# Patient Record
Sex: Male | Born: 1970 | Race: Black or African American | Hispanic: No | Marital: Single | Smoking: Current every day smoker
Health system: Southern US, Community
[De-identification: ages and names within clinical notes are randomized; demographics above are authoritative.]

## PROBLEM LIST (undated history)

## (undated) DIAGNOSIS — F1910 Other psychoactive substance abuse, uncomplicated: Secondary | ICD-10-CM

## (undated) DIAGNOSIS — I10 Essential (primary) hypertension: Secondary | ICD-10-CM

## (undated) DIAGNOSIS — F410 Panic disorder [episodic paroxysmal anxiety] without agoraphobia: Secondary | ICD-10-CM

## (undated) DIAGNOSIS — K859 Acute pancreatitis without necrosis or infection, unspecified: Secondary | ICD-10-CM

## (undated) DIAGNOSIS — Z720 Tobacco use: Secondary | ICD-10-CM

## (undated) DIAGNOSIS — F319 Bipolar disorder, unspecified: Secondary | ICD-10-CM

## (undated) DIAGNOSIS — F419 Anxiety disorder, unspecified: Secondary | ICD-10-CM

---

## 2001-05-11 ENCOUNTER — Emergency Department (HOSPITAL_COMMUNITY): Admission: EM | Admit: 2001-05-11 | Discharge: 2001-05-11 | Payer: Self-pay | Admitting: *Deleted

## 2001-05-11 ENCOUNTER — Encounter: Payer: Self-pay | Admitting: *Deleted

## 2001-05-22 ENCOUNTER — Ambulatory Visit (HOSPITAL_COMMUNITY): Admission: RE | Admit: 2001-05-22 | Discharge: 2001-05-22 | Payer: Self-pay | Admitting: Family Medicine

## 2001-05-22 ENCOUNTER — Encounter: Payer: Self-pay | Admitting: Family Medicine

## 2001-12-03 ENCOUNTER — Emergency Department (HOSPITAL_COMMUNITY): Admission: EM | Admit: 2001-12-03 | Discharge: 2001-12-03 | Payer: Self-pay | Admitting: *Deleted

## 2001-12-03 ENCOUNTER — Encounter: Payer: Self-pay | Admitting: *Deleted

## 2002-05-21 ENCOUNTER — Emergency Department (HOSPITAL_COMMUNITY): Admission: EM | Admit: 2002-05-21 | Discharge: 2002-05-22 | Payer: Self-pay | Admitting: Emergency Medicine

## 2002-05-22 ENCOUNTER — Encounter: Payer: Self-pay | Admitting: Emergency Medicine

## 2002-09-08 ENCOUNTER — Emergency Department (HOSPITAL_COMMUNITY): Admission: EM | Admit: 2002-09-08 | Discharge: 2002-09-08 | Payer: Self-pay | Admitting: Emergency Medicine

## 2003-05-13 ENCOUNTER — Emergency Department (HOSPITAL_COMMUNITY): Admission: EM | Admit: 2003-05-13 | Discharge: 2003-05-13 | Payer: Self-pay | Admitting: Emergency Medicine

## 2004-03-31 ENCOUNTER — Emergency Department (HOSPITAL_COMMUNITY): Admission: EM | Admit: 2004-03-31 | Discharge: 2004-03-31 | Payer: Self-pay | Admitting: *Deleted

## 2004-11-20 ENCOUNTER — Emergency Department (HOSPITAL_COMMUNITY): Admission: EM | Admit: 2004-11-20 | Discharge: 2004-11-20 | Payer: Self-pay | Admitting: *Deleted

## 2004-12-24 ENCOUNTER — Emergency Department (HOSPITAL_COMMUNITY): Admission: EM | Admit: 2004-12-24 | Discharge: 2004-12-24 | Payer: Self-pay | Admitting: Emergency Medicine

## 2005-01-10 ENCOUNTER — Emergency Department (HOSPITAL_COMMUNITY): Admission: EM | Admit: 2005-01-10 | Discharge: 2005-01-10 | Payer: Self-pay | Admitting: *Deleted

## 2005-02-18 ENCOUNTER — Emergency Department (HOSPITAL_COMMUNITY): Admission: EM | Admit: 2005-02-18 | Discharge: 2005-02-18 | Payer: Self-pay | Admitting: *Deleted

## 2005-02-19 ENCOUNTER — Emergency Department (HOSPITAL_COMMUNITY): Admission: EM | Admit: 2005-02-19 | Discharge: 2005-02-19 | Payer: Self-pay | Admitting: Emergency Medicine

## 2005-02-20 ENCOUNTER — Ambulatory Visit: Payer: Self-pay | Admitting: Family Medicine

## 2006-11-01 ENCOUNTER — Emergency Department (HOSPITAL_COMMUNITY): Admission: EM | Admit: 2006-11-01 | Discharge: 2006-11-01 | Payer: Self-pay | Admitting: Emergency Medicine

## 2007-02-13 ENCOUNTER — Emergency Department (HOSPITAL_COMMUNITY): Admission: EM | Admit: 2007-02-13 | Discharge: 2007-02-13 | Payer: Self-pay | Admitting: Emergency Medicine

## 2007-04-06 ENCOUNTER — Emergency Department (HOSPITAL_COMMUNITY): Admission: EM | Admit: 2007-04-06 | Discharge: 2007-04-06 | Payer: Self-pay | Admitting: Emergency Medicine

## 2007-04-28 ENCOUNTER — Emergency Department (HOSPITAL_COMMUNITY): Admission: EM | Admit: 2007-04-28 | Discharge: 2007-04-28 | Payer: Self-pay | Admitting: Emergency Medicine

## 2008-04-12 ENCOUNTER — Ambulatory Visit: Payer: Self-pay | Admitting: Internal Medicine

## 2008-04-12 ENCOUNTER — Inpatient Hospital Stay (HOSPITAL_COMMUNITY): Admission: EM | Admit: 2008-04-12 | Discharge: 2008-04-14 | Payer: Self-pay | Admitting: Emergency Medicine

## 2009-01-24 ENCOUNTER — Emergency Department (HOSPITAL_COMMUNITY): Admission: EM | Admit: 2009-01-24 | Discharge: 2009-01-24 | Payer: Self-pay | Admitting: Emergency Medicine

## 2009-02-08 ENCOUNTER — Emergency Department (HOSPITAL_COMMUNITY): Admission: EM | Admit: 2009-02-08 | Discharge: 2009-02-08 | Payer: Self-pay | Admitting: Emergency Medicine

## 2009-07-05 ENCOUNTER — Emergency Department (HOSPITAL_COMMUNITY): Admission: EM | Admit: 2009-07-05 | Discharge: 2009-07-05 | Payer: Self-pay | Admitting: Emergency Medicine

## 2009-10-02 IMAGING — US US ABDOMEN COMPLETE
1 series · 14 of 25 positions shown · non-contrast
Comparison: CT 04/12/2008

CLINICAL DATA: Acute pancreatitis

ABDOMEN ULTRASOUND
TECHNIQUE: Complete abdominal ultrasound examination was performed
including evaluation of the liver, gallbladder, bile ducts,
pancreas, kidneys, spleen, IVC, and abdominal aorta.

[Series 1: unknown · 0.25mm/px · 14 of 70 slices shown]
[im 1/70]
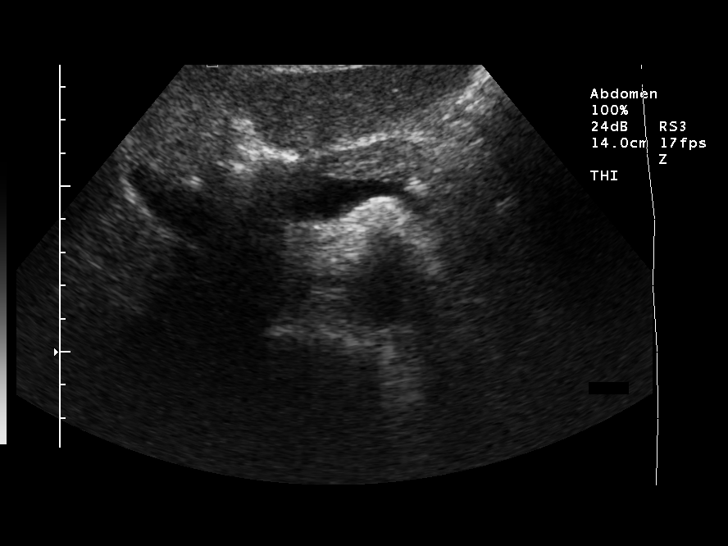
[im 6/70]
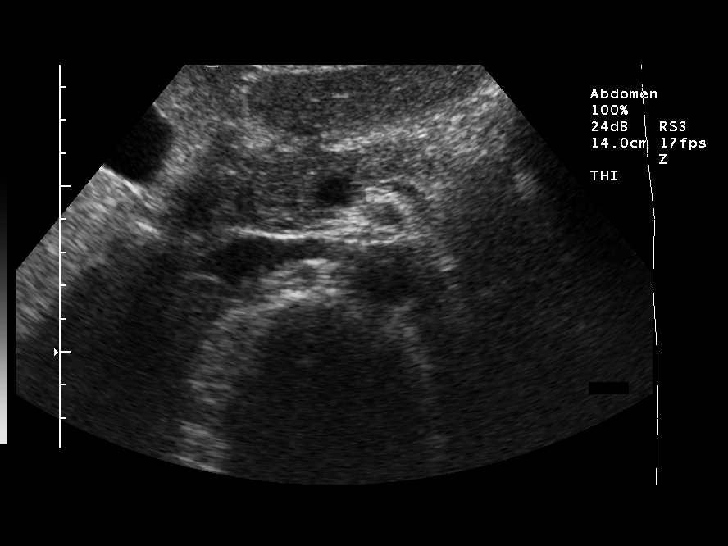
[im 12/70]
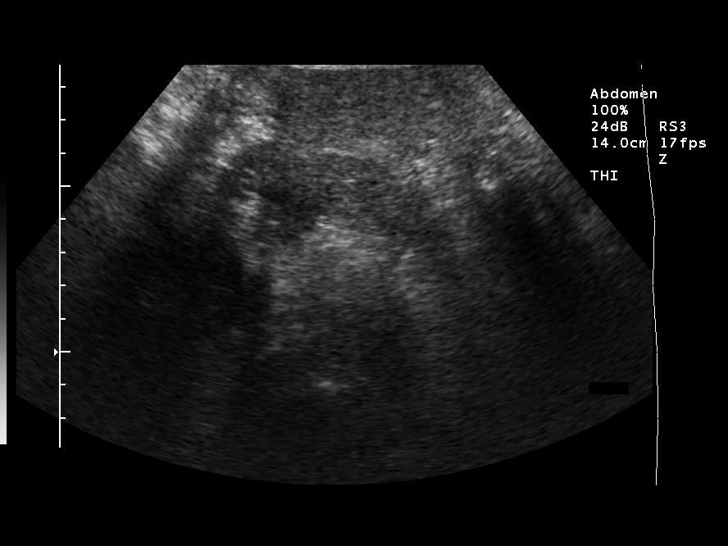
[im 18/70]
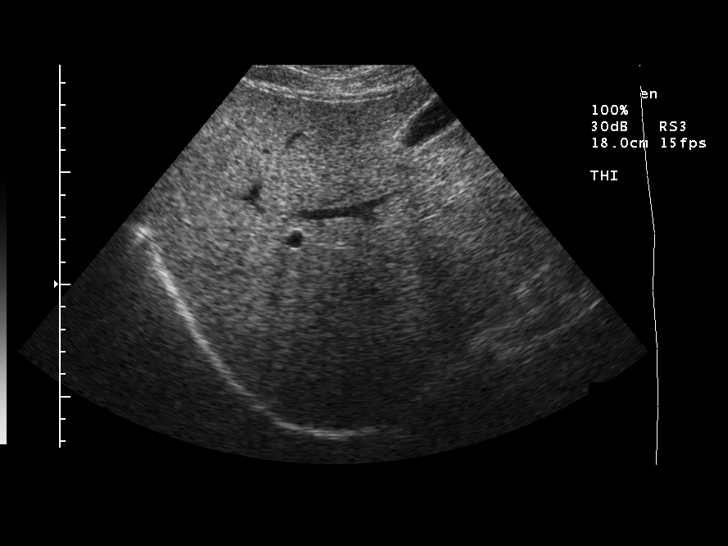
[im 24/70]
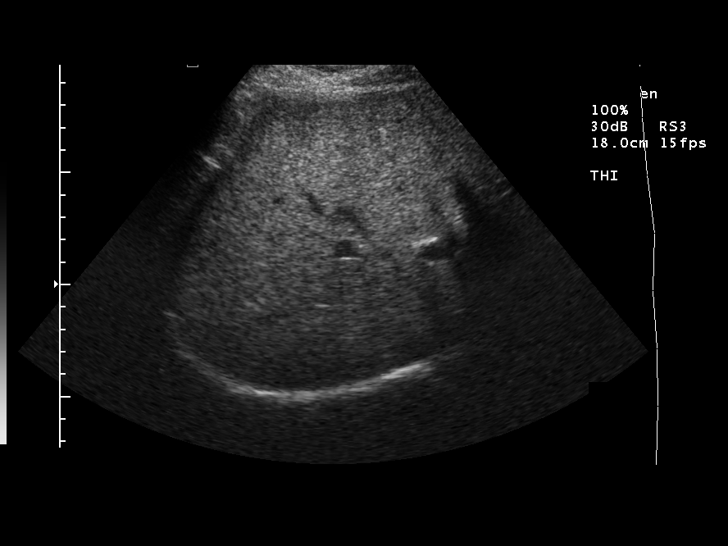
[im 26/70]
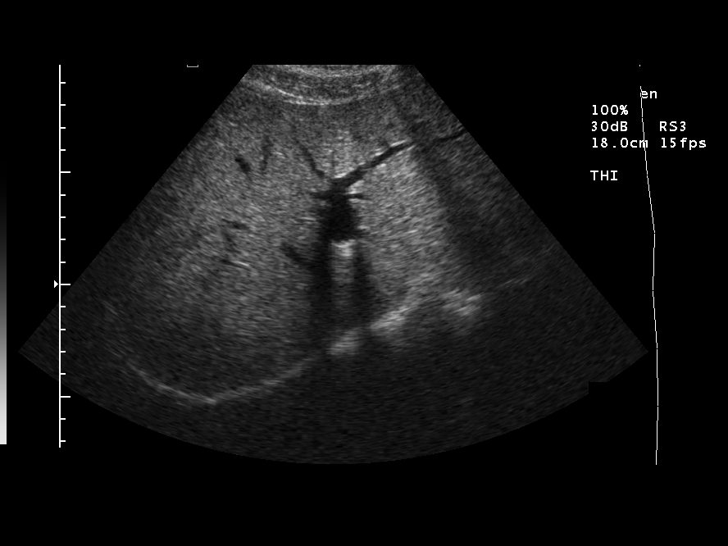
[im 32/70]
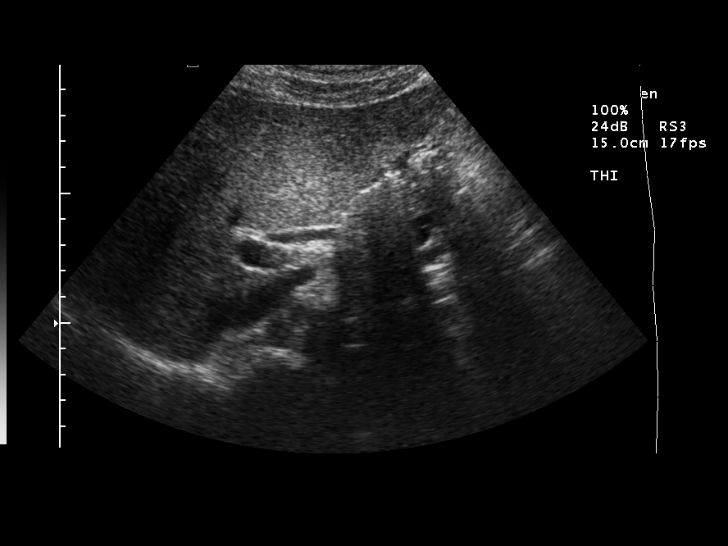
[im 38/70]
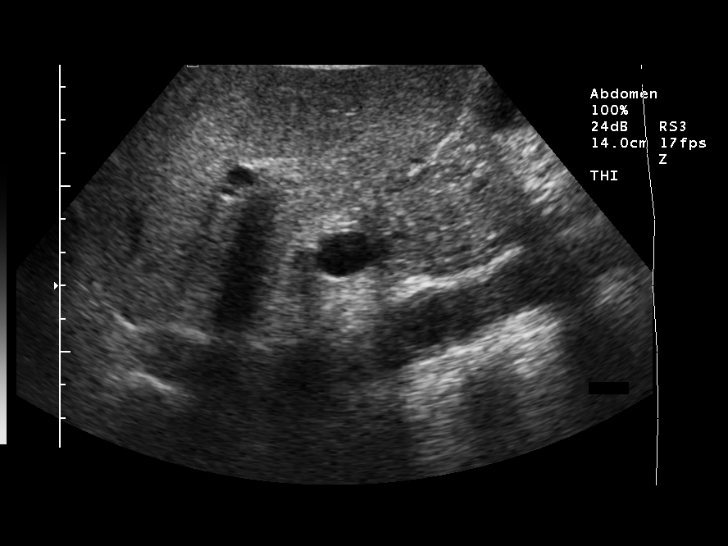
[im 44/70]
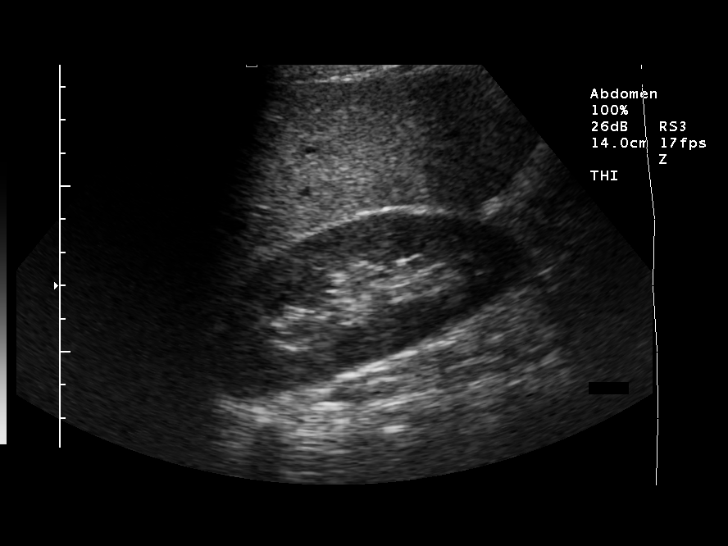
[im 47/70]
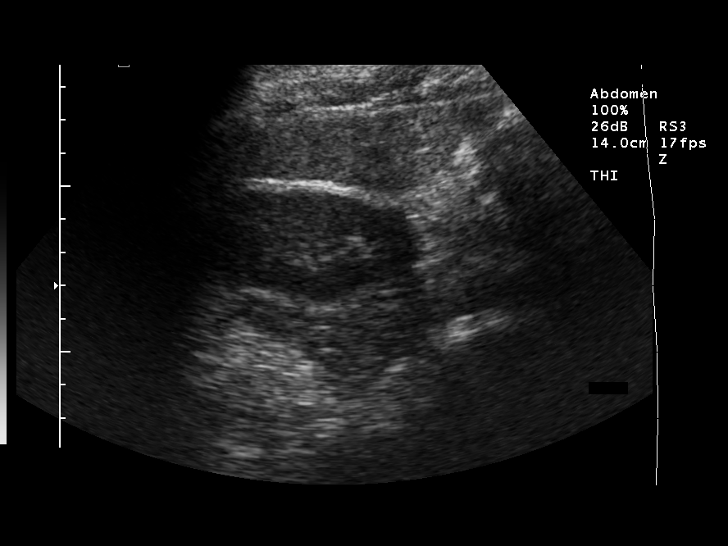
[im 52/70]
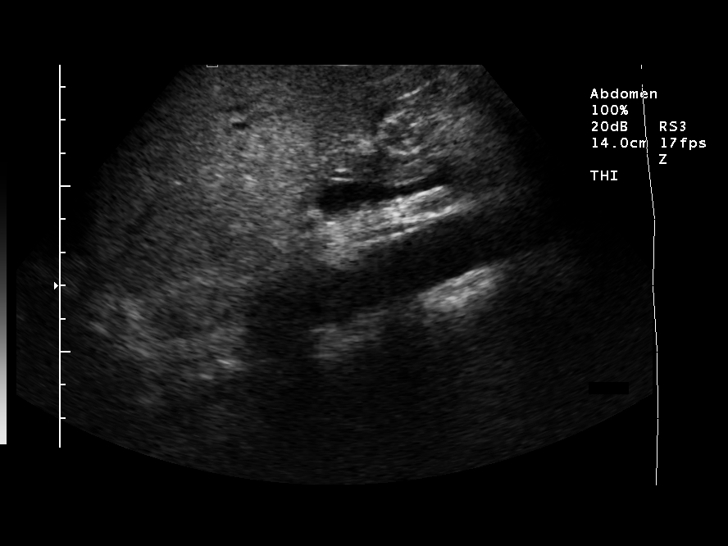
[im 58/70]
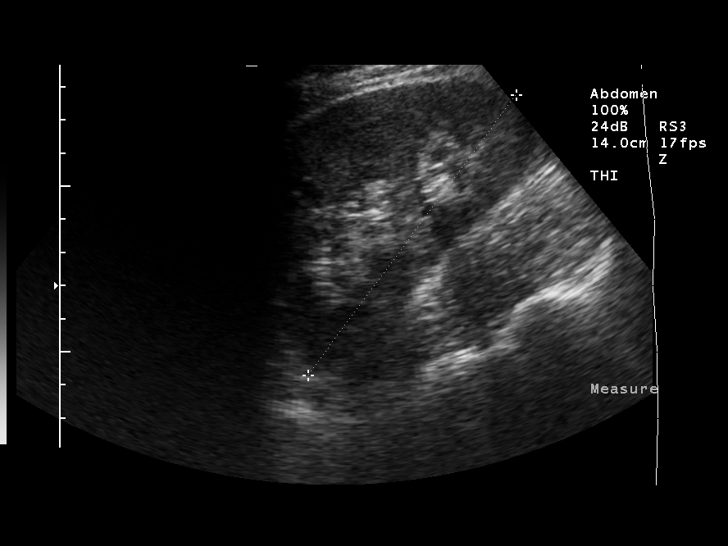
[im 64/70]
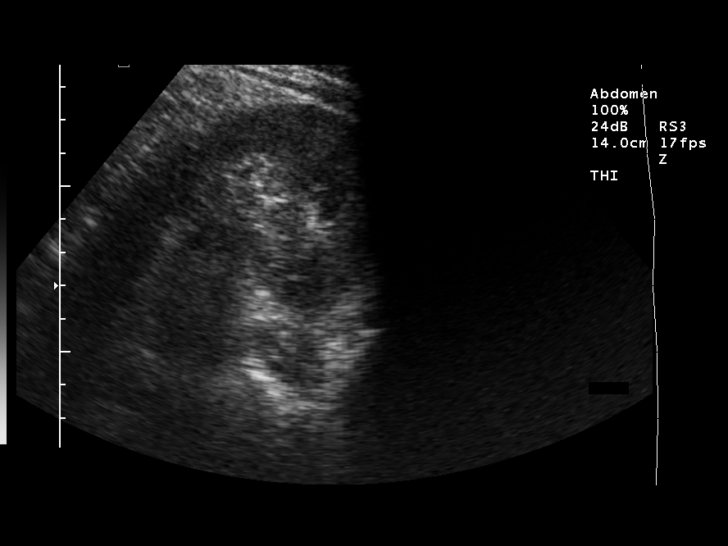
[im 70/70]
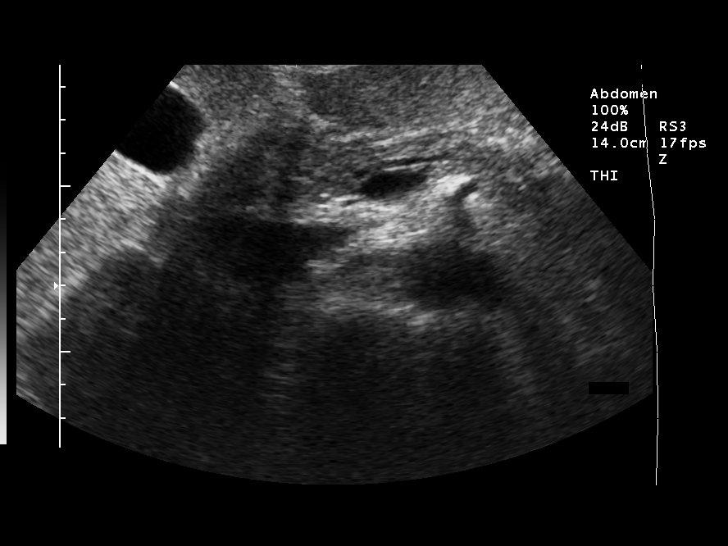

[14 of 25 positions shown; findings below may reference images not displayed]

FINDINGS: Normal gallbladder.  No gallstones.  Wall thickness of
the gallbladder 1.9 mm.  Common duct measures 6 mm in diameter
which is towards the upper limits of normal.  No pancreatic
parenchymal abnormality.  Pancreatic duct slightly prominent
measuring 3.7 mm in diameter.  The liver appears slightly echogenic
compatible with mild fatty infiltration.  Spleen measures 9.5 cm in
length and appears unremarkable.  The right and left kidneys
measure 11.6 cm and 10.5 cm in length, respectively.  Patent IVC.
Abdominal aorta maximal diameters 2 cm.
IMPRESSION: Mild dilatation of the pancreatic duct.  Normal gallbladder.
Common duct 6 mm which is upper normal. Mild fatty infiltration of
the liver.

## 2010-12-19 NOTE — Discharge Summary (Signed)
[name], [name]              ACCOUNT NO.:  [id number]   MEDICAL RECORD NO.:  [id number]          PATIENT TYPE:  INP   LOCATION:  A320                          FACILITY:  [facility]   PHYSICIAN:  Osvaldo Shipper, MD     DATE OF BIRTH:  23-Jul-1971   DATE OF ADMISSION:  04/12/2008  DATE OF DISCHARGE:  09/09/2009LH                               DISCHARGE SUMMARY   PATIENT LEFT AGAINST MEDICAL ADVICE   Please review H&P dictated by Dr. Lilian Kapur for details regarding the  patient's presenting illness.   DISCHARGE DIAGNOSES:  1. Acute alcoholic pancreatitis, improved.  2. Alcohol abuse.  3. Possible alcohol withdrawal syndrome.  4. History of bipolar disorder.   BRIEF HOSPITAL COURSE:  Briefly, this is a 40 year old African American  male who presented to the hospital with complaints of abdominal pain.  The patient was evaluated in the ED and was found to have elevated  amylase and lipase.  He also had a slightly elevated AST.  The patient  was admitted to the hospital for further evaluation and treatment.  The  patient underwent CT scan of the abdomen and pelvis, which showed fatty  infiltration of the liver with no other acute findings.  He also  underwent ultrasound of the abdomen, which showed mild dilatation of the  pancreatic duct with fatty infiltration of the liver and no other acute  findings.   The patient was initially kept n.p.o. and was given IV fluids.  His  pancreatic enzymes normalized.  His abdominal pain improved.  Hepatitis  B surface antigen, as well as hep-C antibody were both negative.  Urine  drug screen was negative for cocaine.  His alcohol level was 334.   This morning, I was informed by the nurse that the patient is keen on  going home.  Upon evaluation, the patient was having a few tremors.  He  is complaining of minimal back pain.  Otherwise, denies any nausea or  vomiting.   PHYSICAL EXAMINATION:  GENERAL:  He is alert and oriented times three.  VITAL  SIGNS:  He is afebrile.  His heart rate is in the early 100s,  irregular, respiratory rate is 20, blood pressure has going up and was  144/97, 155/104, his saturation is 94% on room air.  LUNGS:  Clear to auscultation bilaterally.  CARDIOVASCULAR:  S1 and S2 are satisfactory.  Cardiac:  Irregular, no  murmur is appreciated.  ABDOMEN:  Obese, tenderness in the epigastric area.  No rebound,  rigidity, or guarding is present.  Bowel sounds are appreciated.  EXTREMITIES:  No edema.   LABORATORY DATA:  Actually no labs available from this morning.   It appears that the patient is going through alcohol withdrawal.  I have  explained that this is a dangerous situation.  I have told him that he  needs to stay in the hospital for medical management.  The patient is  refusing this at this time and wants to go home.  I have explained all  of the risks associated with doing this, which includes seizure  activities and comatose state, including ultimately  death.  The patient  understands this and still wants to go home.   So he will be leaving against medical advice.   At home he is on the following medications:  1. Depakote 1000 mg daily.  2. Lexapro 20 mg daily.  3. Neurontin 300 mg t.i.d.   No new medications have been prescribed at the time the patient is  leaving.  He is on a CIWA/Ativan protocol as an inpatient.      Osvaldo Shipper, MD  Electronically Signed     GK/MEDQ  D:  04/14/2008  T:  04/14/2008  Job:  [id number]

## 2010-12-19 NOTE — H&P (Signed)
[name], [name]              ACCOUNT NO.:  [id number]   MEDICAL RECORD NO.:  [id number]          PATIENT TYPE:  INP   LOCATION:  A320                          FACILITY:  [facility]   PHYSICIAN:  Skeet Latch, DO    DATE OF BIRTH:  [birth year]   DATE OF ADMISSION:  04/12/2008  DATE OF DISCHARGE:  LH                              HISTORY & PHYSICAL   CHIEF COMPLAINT:  Abdominal pain.   HISTORY OF PRESENT ILLNESS:  This is a 40 year old African American male  who presents complaining of severe abdominal pain.  The patient states  that the pain started weeks prior after drinking.  The patient was a  [facility] for approximately 5 days about 3 to 4 weeks prior and  was diagnosed with pancreatitis, sent home, and told  to abstain from  alcohol.  The patient presented today complaining of more severe pain.  He states he has been drinking and that the pain is worse than when he  was at [facility].  The patient does have some nausea.  Does not  admit to any vomiting.  The patient states that the pain is in his  epigastric region but radiates to his lower abdomen and to his left  flank.  He described it as moderate to severe.  It is not aggravated by  any medications or foods.  On being seen in the emergency room, the  patient did have an alcohol level which was 334.  His lipase was 65, his  amylase was 243.  Urine drug screen was unremarkable.  At this point, he  is still complaining of severe abdominal pain.   PAST MEDICAL HISTORY:  Positive for pancreatitis, asthma, hypertension,  bipolar disorder.   SURGICAL HISTORY:  Unremarkable.   SOCIAL HISTORY:  No history of drug abuse.  Is a current smoker.  Admits  to drinking fairly regularly, beer.   ALLERGIES:  No known drug allergies.   HOME MEDICATIONS:  1. Depakote 1000 mg once a day.  2. Lexapro 20 mg once a day.  3. Neurontin 300 mg 3 times a day.   REVIEW OF SYSTEMS:  GENERAL:  No weight gain, no weight loss.  no  fevers  or chills.  CARDIOVASCULAR:  No chest pain or palpitations.  RESPIRATORY:  No shortness of breath, cough, or wheezing.  GASTROINTESTINAL:  Positive for abdominal pain, nausea, slight vomiting.  GENITOURINARY:  No urgency, frequency, or dysuria.  MUSCULOSKELETAL:  No  arthralgias or myalgias.  All other systems are negative.   PHYSICAL EXAM:  VITAL SIGNS:  Temperature is 97.8, pulse 56,  respirations 20, blood pressure 137/85.  He is satting 97% on room air.  GENERAL:  He is awake and alert.  Well-nourished, well-developed.  HEENT:  Normocephalic, atraumatic.  No scleral icterus.  His eyes are  PERRLA, EOMI.  NECK:  Soft supple.  Nontender, nondistended.  CARDIOVASCULAR:  Regular rate and rhythm.  No murmurs, rubs, or gallops.  LUNGS:  Clear to auscultation bilaterally.  No rhonchi, rales, or  wheezing.  ABDOMEN:  Soft, positive epigastric pain with radiation to his lower  abdomen bilaterally.  Slight rigidity.  EXTREMITIES:  No cyanosis, clubbing, or edema.  NEUROLOGIC:  He is awake, alert and oriented x3.  In no acute distress.  Cranial nerves 2-12 grossly intact.   LABS:  White count is 5.4, hemoglobin 14.8, hematocrit 42.2, platelet  count is 141,000.  Urinalysis is unremarkable.  Lipase 55, amylase 243,  alcohol level is 334, sodium 138, potassium 3.7, chloride 104, CO2 24,  glucose 95, BUN 6, creatinine 0.2.  His total bilirubin is 0.6, alkaline  phosphatase 86, AST 57, ALT 24, total protein 8, albumin 4.1, calcium  9.6.  The patient has an abdominal ultrasound pending at this time.   ASSESSMENT:  1. Probable alcoholic pancreatitis.  2. Epigastric pain.  3. History of bipolar disorder.  4. History of hypertension.  5. History of tobacco abuse.   PLAN:  1. The patient to be admitted to the service of [facility], general      medical bed.  2. Pancreatitis, gastroenterology has been consulted.  The patient      will be kept n.p.o. and will be placed on IV pain  medications.  The      patient has an ultrasound of his abdomen pending at this time.      Will continue to follow his LFTs and his amylase and lipase.  3. For bipolar disorder, the patient will be placed on his home      medications.  4. For his hypertension, the patient is not on any medications at this      time.  Continue to monitor his blood pressure and will add      medications as needed.  5. For his tobacco abuse, the patient will be placed on a nicotine      patch.  6. The patient will be on DVT as well as GI prophylaxis.      Skeet Latch, DO  Electronically Signed     SM/MEDQ  D:  04/12/2008  T:  04/12/2008  Job:  [id number]

## 2010-12-19 NOTE — Consult Note (Signed)
[name], [name]              ACCOUNT NO.:  [id number]   MEDICAL RECORD NO.:  [id number]          PATIENT TYPE:  INP   LOCATION:  A320                          FACILITY:  [facility]   PHYSICIAN:  R. Roetta Sessions, M.D. DATE OF BIRTH:  24-Jun-1971   DATE OF CONSULTATION:  DATE OF DISCHARGE:                                 CONSULTATION   REFERRING PHYSICIAN:  [facility].   REASON FOR CONSULTATION:  Acute abdominal pain, nausea, vomiting.   HISTORY OF PRESENT ILLNESS:  Mr. [name] is a 40 year old African  American male who has a history of alcohol abuse.  He admits that he  drinks anywhere from 5 beers or more per day.  He began to have severe  right upper quadrant upper abdominal pain approximately 4 days ago along  with nausea and vomiting.  He notes the pain radiates through to his  back, as well as his lower abdomen.  He presented to the ER last night  for admission.  He also notes profound weakness and tells me even had  one episode of hematemesis.  He rates the pain 9/10 at worst on the pain  scale.  He had drank alcohol heavily for about 20 years now.  He gives a  previous history of pancreatitis where he was seen at [facility] and spent about 5 days inpatient due to pancreatitis.  He  denies any history of heartburn, indigestion, or rectal bleeding or  melena.  He has had anorexia for a couple of months.  He has had  intermittent nausea and vomiting for a couple of months as well.  Although, his weight has declined some, he is unable to quantify.   PAST MEDICAL AND SURGICAL HISTORY:  1. Asthma.  2. Hypertension.  3. Bipolar disorder.  4. Previous episode of pancreatitis 1 month ago.  5. Alcohol abuse.  6. Depression.   CURRENT MEDICATIONS:  1. Depakote 1 gram daily.  2. Lexapro 20 mg daily.  3. Neurontin 300 mg b.i.d.   ALLERGIES:  NO KNOWN DRUG ALLERGIES.   FAMILY HISTORY:  There is no known family history depression, acute or  chronic GI  problems or pancreatitis.  Mother is 9 and healthy.  Father  is 24 and he is healthy.  He has 9 healthy siblings.   SOCIAL HISTORY:  He denies any drug use.  He is a smoker.  He lives with  his mother.  He has been unemployed for the last year.  He is single.  He has 2 children ages 76 and 15, both are healthy.   REVIEW OF SYSTEMS:  See HPI, otherwise negative.   PHYSICAL EXAMINATION:  Temp 97.8, pulse 66, respirations 20, blood  pressure 137/85, O2 SAT 97% on room air, 67 inches and weight is 70.2  kg.  GENERAL:  Mr. [name] is a 40 year old African American male who is  alert, oriented, pleasant and cooperative in no acute distress.  HEENT:  Sclerae clear, nonicteric.  Conjunctivae pink.  Oropharynx is  pink and moist without any lesions.  NECK:  Supple without thyromegaly.  HEART:  Regular  rate and rhythm.  Normal S1 and S2.  No murmurs, rubs,  or gallops.  LUNGS:  Clear to auscultation bilaterally.  ABDOMEN:  Positive bowel sounds x4.  No bruits auscultated.  He does  have mild tenderness to the epigastrium in the right upper quadrant, as  well as the left upper quadrant on deep palpation.  There is no rebound,  tenderness or guarding.  No hepatosplenomegaly or mass.  EXTREMITIES:  Without clubbing or edema.  SKIN:  He has multiple tattoos.   WBC is 5.4, hemoglobin 14.8, hematocrit 42.2, platelets 141.  Calcium  9.6, sodium 138, potassium 3.7, chloride 107, CO2 of 24, BUN 6,  creatinine 0.82 and glucose 95.  His LFTs are normal, except for an AST  of 57, amylase of 243, lipase 65 and alcohol level of 334.   IMPRESSION:  Mr. [name] is a 40 year old African American male with a  history of alcohol abuse and acute nausea and vomiting, right upper  abdominal pain.  I suspect alcoholic pancreatitis, but we should rule  out cholelithiasis given acute right upper quadrant abdominal pain,  gastroenteritis and peptic ulcer disease remain in the differential as  does Mallory-Weiss  tear.   PLAN:  1. Agree with daily PPI.  2. CT of the abdomen and pelvis with IV and oral contrast.  3. Supportive measures.  4. Suggest DT protocol for withdrawal.  5. We will request the records from previous hospitalization and      [facility].   We would like to thank the [facility] for allowing [place] to  participate in the care of Mr. Godeaux.      Lorenza Burton, N.P.      Jonathon Bellows, M.D.  Electronically Signed    KJ/MEDQ  D:  04/12/2008  T:  04/12/2008  Job:  [id number]

## 2010-12-19 NOTE — Group Therapy Note (Signed)
NAMEKELVEN, FLATER              ACCOUNT NO.:  [id number]   MEDICAL RECORD NO.:  [id number]          PATIENT TYPE:  INP   LOCATION:  A320                          FACILITY:  [facility]   PHYSICIAN:  Skeet Latch, DO    DATE OF BIRTH:  [birth year]   DATE OF PROCEDURE:  04/13/2008  DATE OF DISCHARGE:                                 PROGRESS NOTE   SUBJECTIVE:  [name] is a 40 year old African American male who  presented with severe abdominal pain.  The patient states that he did  some prior drinking of alcohol prior to the pain.  The patient states  that he was at [facility] for a 5-day hospital stay 3-4 weeks  prior for pancreatitis related to alcohol abuse.  The patient states  that the pain is almost worse because admitted to drinking alcohol.  The  patient described the pain as moderate to severe.  The patient was found  to have an alcohol level of 334, lipase of 55, amylase 243.  The  patient's pain has improved today.  The patient was in the process of  getting an abdominal ultrasound on my exam.   OBJECTIVE:  VITAL SIGNS:  Temperature 98.2, pulse 72, respirations 20,  blood pressure 152/88.  CARDIOVASCULAR:  Regular rate and rhythm.  No murmurs, rubs, or gallops.  LUNGS:  Clear to auscultation bilaterally.  No rales, rhonchi, or  wheezes.  ABDOMEN:  Soft.  Decreased epigastric tenderness.  Positive bowel  sounds.  No rigidity or guarding.  EXTREMITIES:  No clubbing, cyanosis, or edema.   LABORATORY DATA:  Lipase 35.  Sodium 137, potassium 3.8, chloride 102,  CO2 26, glucose 65, BUN 5, creatinine 0.91.  Total bilirubin is 1.3,  direct bilirubin 0.2, alkaline phosphatase 70, AST 76, ALT 27.  White  count 7.1, hemoglobin 13.1, hematocrit 36.9, platelet count is 117.   Abdominal ultrasound showed mild dilatation of the pancreatic duct,  normal gallbladder, common duct 6 mm which is upper normal, mild fatty  infiltration of the liver.   ASSESSMENT/PLAN:  1.  Abdominal pain, resolving.  2. With respect to alcohol, pancreatitis.  The patient's diet was      advanced.  He is being seen by gastroenterology.  CT of his abdomen      showed unremarkable, but with fatty infiltrates on the liver.  The      patient probably can be discharged tomorrow morning with      instructions to abstain from any alcohol abuse.  The patient needs      to be sent home on a proton pump inhibitor.  It has come to my      attention the patient was due for a court date today, unsure for      what reason.  According to the patient he is responsible for      calling the court if he is going to miss his court date.  A letter      has been placed in the patient's chart for the patient to take to      explain that the patient  was in the hospital during the last few days.  I do not feel the      patient will need any any narcotics on discharge.  The patient may      need to be sent on thiamine and folic acid due to the alcohol abuse      and the patient may need instructions on possible rehab facilities.      Skeet Latch, DO  Electronically Signed     SM/MEDQ  D:  04/13/2008  T:  04/13/2008  Job:  [id number]

## 2010-12-24 IMAGING — CR DG HAND COMPLETE 3+V*R*
3 series · 3 of 3 positions shown · non-contrast
Comparison: None

CLINICAL DATA: Pain post fall

RIGHT HAND - COMPLETE 3+ VIEW

[view not recorded (1 of 3)]
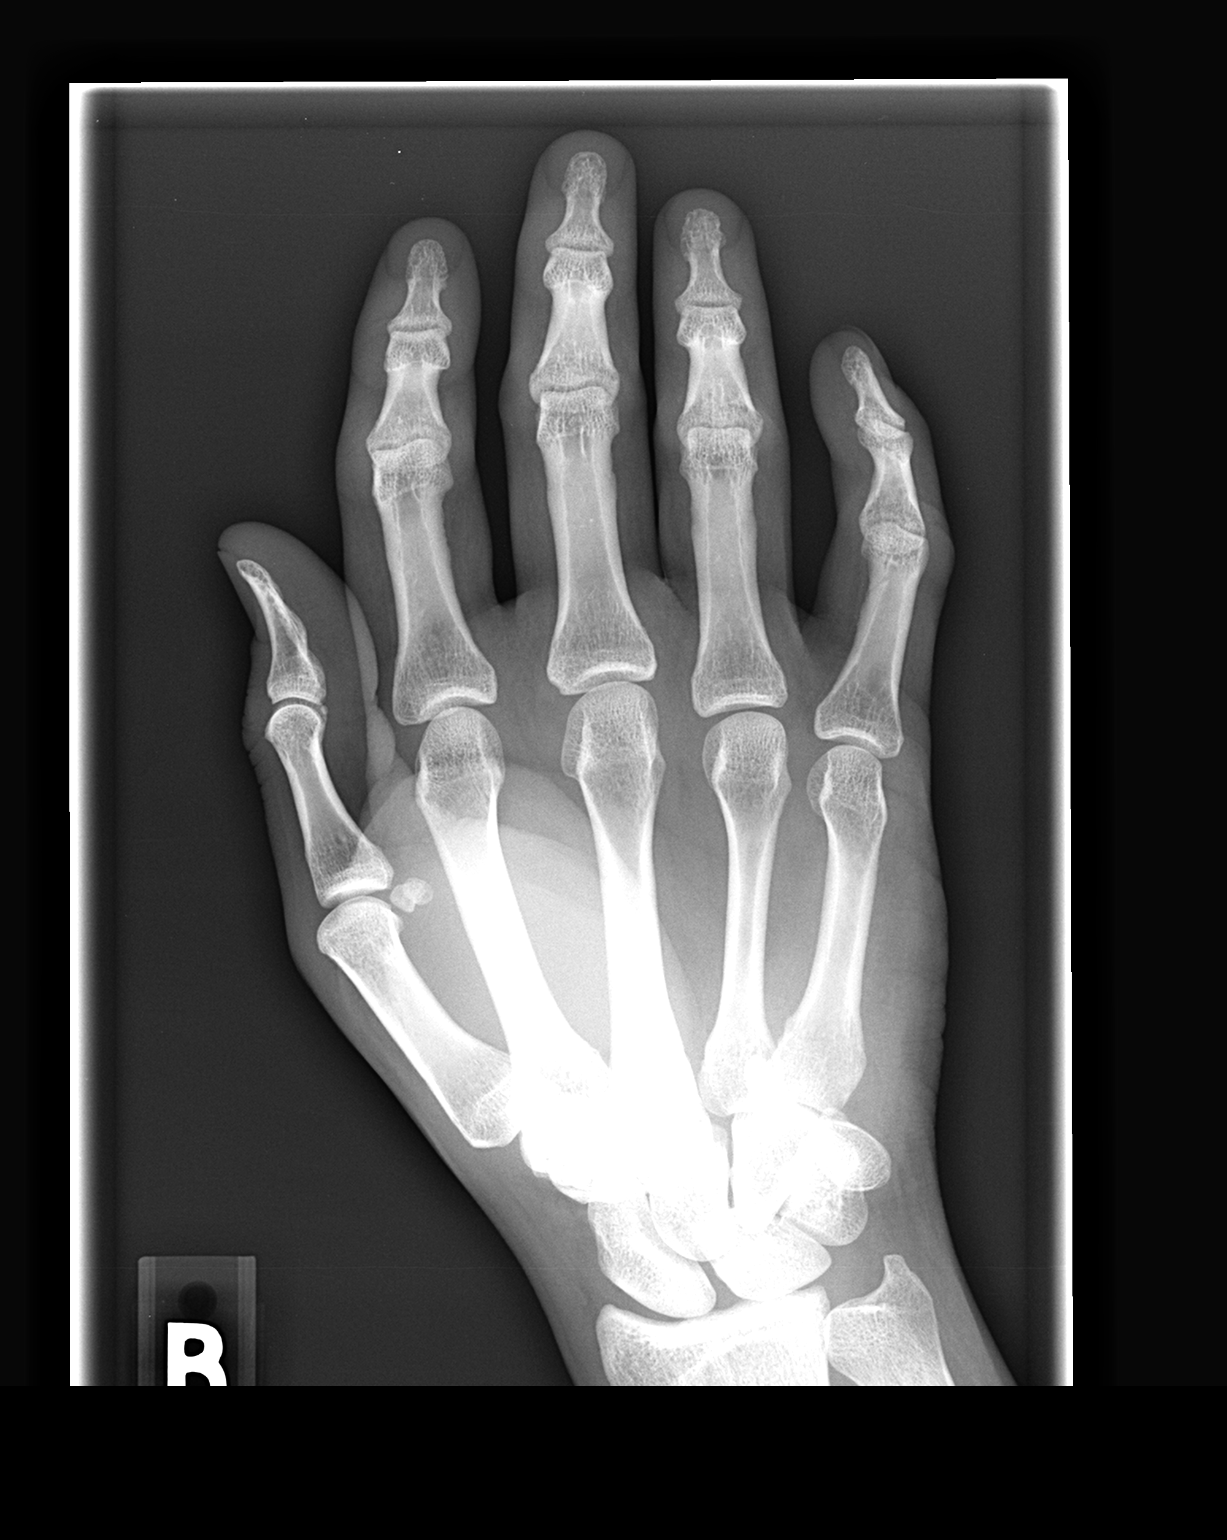

[view not recorded (2 of 3)]
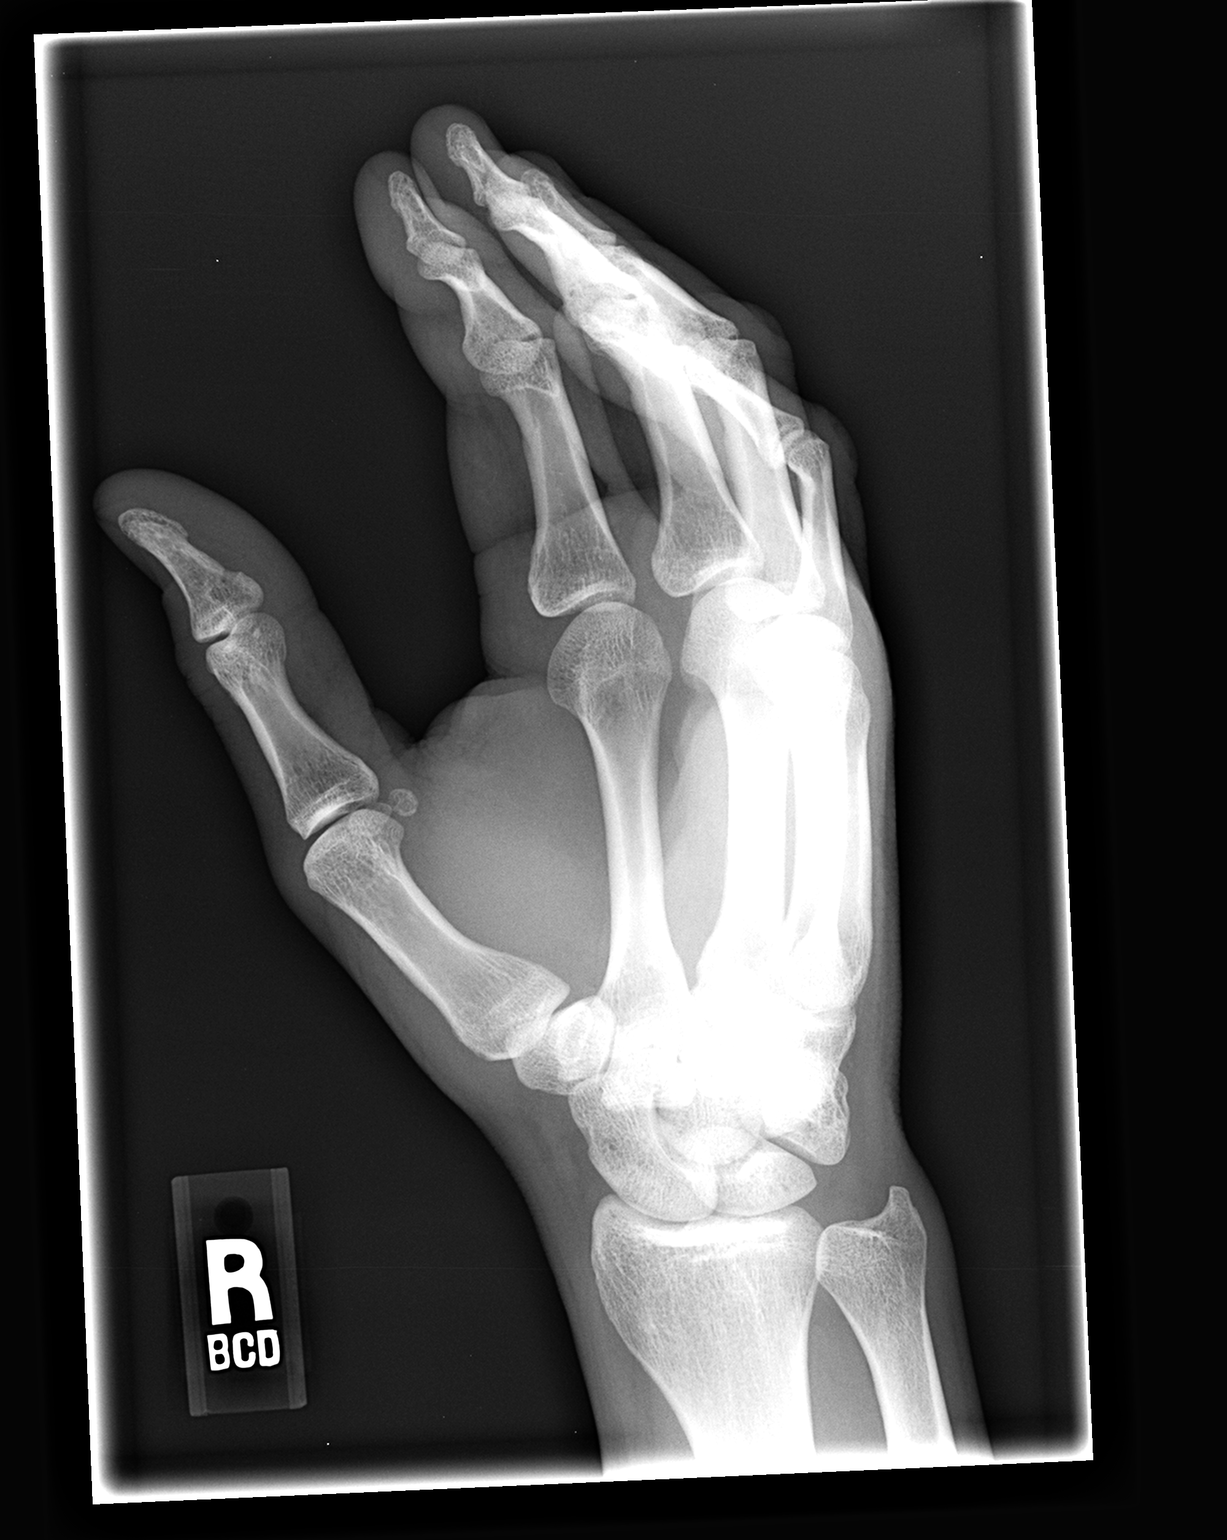

[view not recorded (3 of 3)]
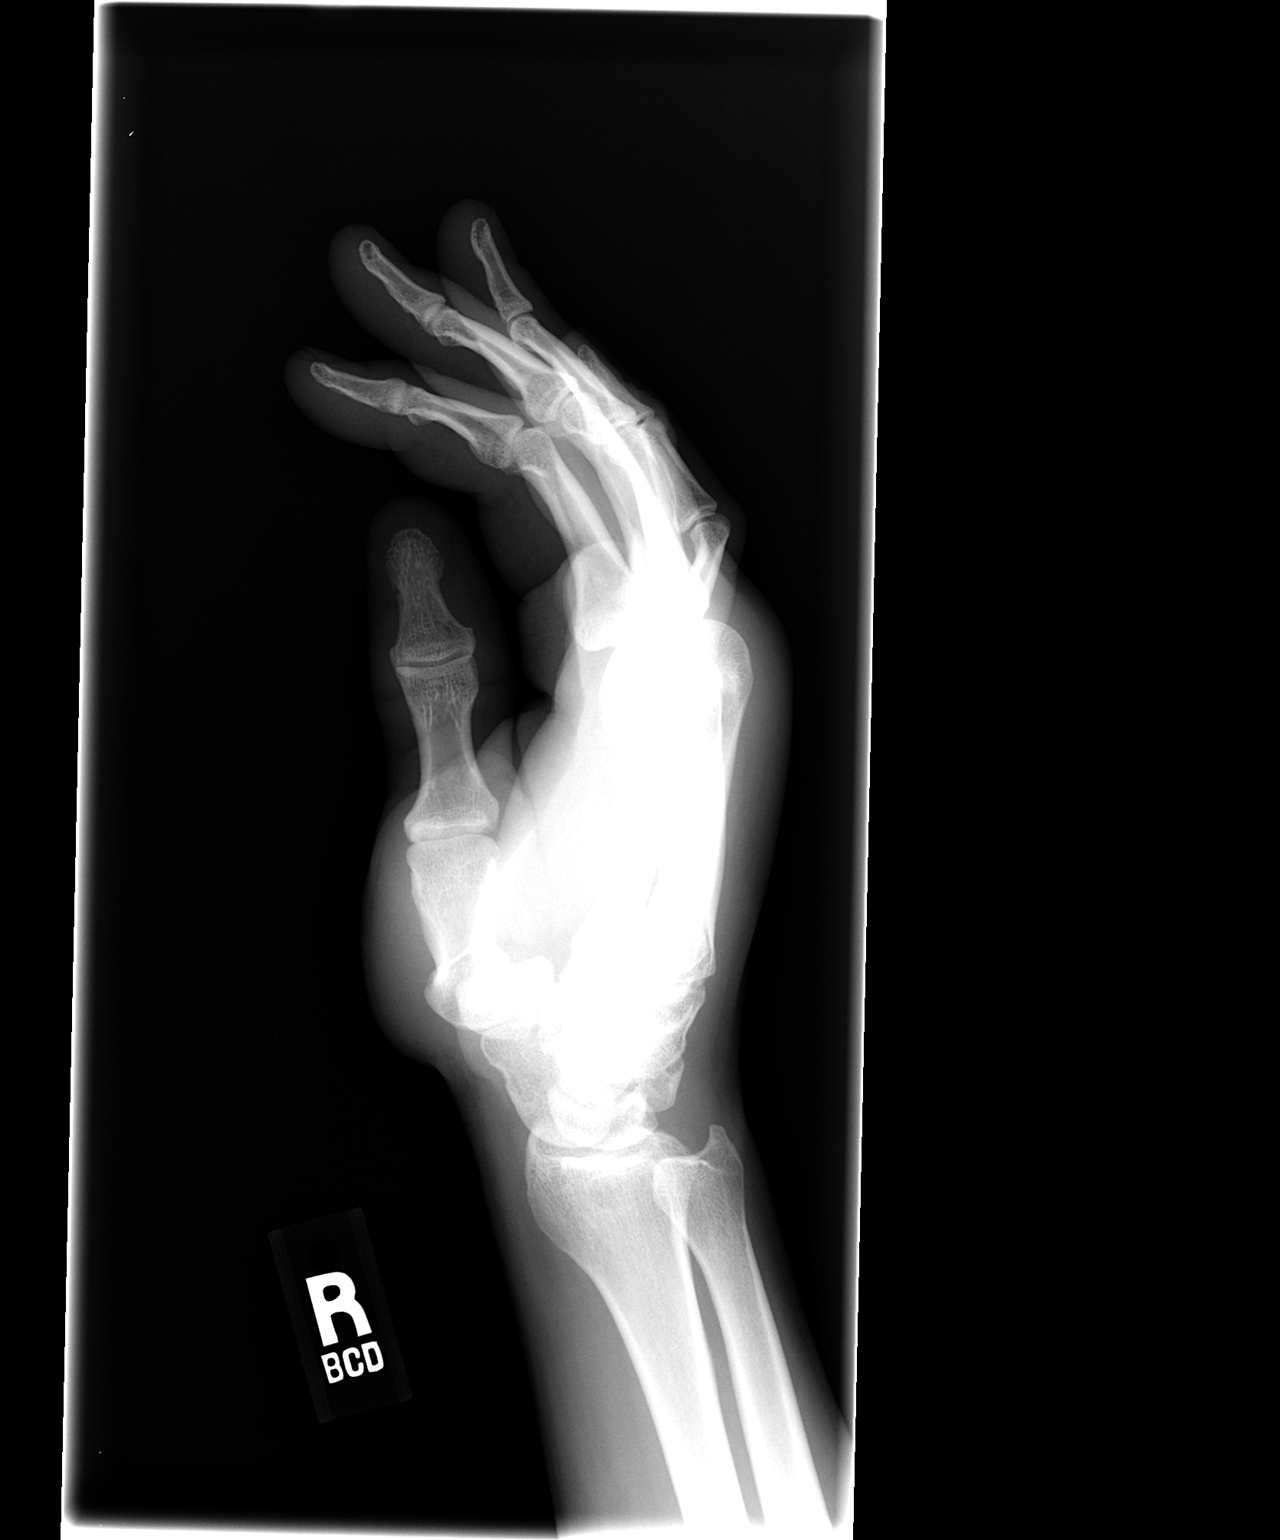

[3 of 3 positions shown; findings below may reference images not displayed]

FINDINGS: Carpal rows intact. Negative for fracture, dislocation,
or other acute abnormality.  Normal alignment and mineralization.
No significant degenerative change.  Regional soft tissues
unremarkable.
IMPRESSION: Negative

## 2011-05-09 LAB — CBC
HCT: 36.9 — ABNORMAL LOW
HCT: 42.2
Hemoglobin: 14.8
MCV: 95.8
Platelets: 117 — ABNORMAL LOW
RBC: 4.47
RDW: 12.3

## 2011-05-09 LAB — COMPREHENSIVE METABOLIC PANEL
ALT: 24
Alkaline Phosphatase: 86
BUN: 6
CO2: 24
Chloride: 104
GFR calc non Af Amer: >60
Glucose, Bld: 95
Potassium: 3.7
Sodium: 138
Total Bilirubin: 0.6

## 2011-05-09 LAB — DIFFERENTIAL
Basophils Absolute: 0
Basophils Absolute: 0.1
Basophils Relative: 1
Blasts: 0
Eosinophils Absolute: 0.1
Eosinophils Relative: 1
Myelocytes: 0
Neutro Abs: 2.1
Neutrophils Relative %: 38 — ABNORMAL LOW
Promyelocytes Absolute: 0

## 2011-05-09 LAB — RAPID URINE DRUG SCREEN, HOSP PERFORMED
Benzodiazepines: NOT DETECTED
Cocaine: NOT DETECTED
Opiates: NOT DETECTED
Tetrahydrocannabinol: NOT DETECTED

## 2011-05-09 LAB — URINALYSIS, ROUTINE W REFLEX MICROSCOPIC
Bilirubin Urine: NEGATIVE
Glucose, UA: NEGATIVE
Ketones, ur: NEGATIVE
Nitrite: NEGATIVE
pH: 6

## 2011-05-09 LAB — BASIC METABOLIC PANEL
BUN: 5 — ABNORMAL LOW
Chloride: 102
Glucose, Bld: 65 — ABNORMAL LOW
Potassium: 3.8

## 2011-05-09 LAB — HEPATIC FUNCTION PANEL
Indirect Bilirubin: 1.1 — ABNORMAL HIGH
Total Protein: 6.8

## 2011-05-09 LAB — AMYLASE: Amylase: 243 — ABNORMAL HIGH

## 2011-05-09 LAB — HEPATITIS C ANTIBODY: HCV Ab: NEGATIVE

## 2011-05-09 LAB — ETHANOL: Alcohol, Ethyl (B): 334 — ABNORMAL HIGH

## 2011-05-09 LAB — LIPASE, BLOOD: Lipase: 35

## 2011-05-17 LAB — CBC
HCT: 40.4
Platelets: 142 — ABNORMAL LOW
RDW: 12.3

## 2011-05-17 LAB — COMPREHENSIVE METABOLIC PANEL
Albumin: 3.7
Alkaline Phosphatase: 73
BUN: 7
Potassium: 3.9
Total Protein: 7.4

## 2011-05-17 LAB — ETHANOL: Alcohol, Ethyl (B): 62 — ABNORMAL HIGH

## 2011-05-17 LAB — DIFFERENTIAL
Basophils Relative: 1
Lymphocytes Relative: 28
Lymphs Abs: 1.7
Monocytes Absolute: 0.7
Monocytes Relative: 12 — ABNORMAL HIGH
Neutro Abs: 3.5

## 2011-05-18 LAB — DIFFERENTIAL
Eosinophils Absolute: 0.1
Lymphs Abs: 1.2
Monocytes Absolute: 1.1 — ABNORMAL HIGH
Monocytes Relative: 18 — ABNORMAL HIGH
Neutrophils Relative %: 62

## 2011-05-18 LAB — CBC
Hemoglobin: 14.4
MCV: 93.1
RBC: 4.48
WBC: 6.5

## 2011-05-18 LAB — BASIC METABOLIC PANEL
BUN: 8
CO2: 23
Chloride: 102
Glucose, Bld: 86
Potassium: 3.7

## 2011-05-27 ENCOUNTER — Emergency Department (HOSPITAL_COMMUNITY)
Admission: EM | Admit: 2011-05-27 | Discharge: 2011-05-27 | Disposition: A | Discharge status: Home / Self Care | Payer: Self-pay | Attending: Emergency Medicine | Admitting: Emergency Medicine

## 2011-05-27 ENCOUNTER — Emergency Department (HOSPITAL_COMMUNITY): Payer: Self-pay

## 2011-05-27 DIAGNOSIS — I10 Essential (primary) hypertension: Secondary | ICD-10-CM | POA: Insufficient documentation

## 2011-05-27 DIAGNOSIS — F259 Schizoaffective disorder, unspecified: Secondary | ICD-10-CM | POA: Insufficient documentation

## 2011-05-27 DIAGNOSIS — F17200 Nicotine dependence, unspecified, uncomplicated: Secondary | ICD-10-CM | POA: Insufficient documentation

## 2011-05-27 DIAGNOSIS — H60399 Other infective otitis externa, unspecified ear: Secondary | ICD-10-CM | POA: Insufficient documentation

## 2011-05-27 DIAGNOSIS — S0003XA Contusion of scalp, initial encounter: Secondary | ICD-10-CM | POA: Insufficient documentation

## 2011-05-27 DIAGNOSIS — F319 Bipolar disorder, unspecified: Secondary | ICD-10-CM | POA: Insufficient documentation

## 2011-05-27 DIAGNOSIS — S0093XA Contusion of unspecified part of head, initial encounter: Secondary | ICD-10-CM

## 2011-05-27 DIAGNOSIS — L309 Dermatitis, unspecified: Secondary | ICD-10-CM

## 2011-05-27 DIAGNOSIS — H6012 Cellulitis of left external ear: Secondary | ICD-10-CM

## 2011-05-27 DIAGNOSIS — E119 Type 2 diabetes mellitus without complications: Secondary | ICD-10-CM | POA: Insufficient documentation

## 2011-05-27 DIAGNOSIS — L259 Unspecified contact dermatitis, unspecified cause: Secondary | ICD-10-CM | POA: Insufficient documentation

## 2011-05-27 HISTORY — DX: Panic disorder (episodic paroxysmal anxiety): F41.0

## 2011-05-27 HISTORY — DX: Essential (primary) hypertension: I10

## 2011-05-27 HISTORY — DX: Acute pancreatitis without necrosis or infection, unspecified: K85.90

## 2011-05-27 HISTORY — DX: Anxiety disorder, unspecified: F41.9

## 2011-05-27 HISTORY — DX: Bipolar disorder, unspecified: F31.9

## 2011-05-27 MED ORDER — IBUPROFEN 800 MG PO TABS: 800.0000 mg | ORAL_TABLET | Freq: Once | ORAL | Status: DC

## 2011-05-27 MED ORDER — IBUPROFEN 400 MG PO TABS
600.0000 mg | ORAL_TABLET | Freq: Once | ORAL | Status: AC
  Administered 2011-05-27: 600 mg via ORAL

## 2011-05-27 MED ORDER — ACETAMINOPHEN 500 MG PO TABS
1000.0000 mg | ORAL_TABLET | Freq: Once | ORAL | Status: AC
  Administered 2011-05-27: 1000 mg via ORAL

## 2011-05-27 MED ORDER — ACETAMINOPHEN 500 MG PO TABS
ORAL_TABLET | ORAL | Status: AC
  Filled 2011-05-27: qty 2

## 2011-05-27 MED ORDER — IBUPROFEN 400 MG PO TABS
ORAL_TABLET | ORAL | Status: AC
  Filled 2011-05-27: qty 2

## 2011-05-27 MED ORDER — TRIAMCINOLONE ACETONIDE 0.1 % EX CREA: TOPICAL_CREAM | Freq: Two times a day (BID) | CUTANEOUS | Status: AC

## 2011-05-27 MED ORDER — DOXYCYCLINE HYCLATE 100 MG PO CAPS: 100.0000 mg | ORAL_CAPSULE | Freq: Two times a day (BID) | ORAL | Status: AC

## 2011-05-27 MED ORDER — DOXYCYCLINE HYCLATE 100 MG PO TABS
100.0000 mg | ORAL_TABLET | Freq: Once | ORAL | Status: AC
  Administered 2011-05-27: 100 mg via ORAL
  Filled 2011-05-27: qty 1
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
## 2011-05-27 NOTE — ED Notes (Signed)
Pt left the er stating no needs 
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
## 2011-12-22 ENCOUNTER — Encounter (HOSPITAL_COMMUNITY): Payer: Self-pay | Admitting: *Deleted

## 2011-12-22 ENCOUNTER — Emergency Department (HOSPITAL_COMMUNITY)
Admission: EM | Admit: 2011-12-22 | Discharge: 2011-12-22 | Discharge status: Against Medical Advice | Payer: Self-pay | Attending: Emergency Medicine | Admitting: Emergency Medicine

## 2011-12-22 DIAGNOSIS — Z8659 Personal history of other mental and behavioral disorders: Secondary | ICD-10-CM | POA: Insufficient documentation

## 2011-12-22 DIAGNOSIS — F10929 Alcohol use, unspecified with intoxication, unspecified: Secondary | ICD-10-CM

## 2011-12-22 DIAGNOSIS — F1010 Alcohol abuse, uncomplicated: Secondary | ICD-10-CM | POA: Insufficient documentation

## 2011-12-22 DIAGNOSIS — F319 Bipolar disorder, unspecified: Secondary | ICD-10-CM | POA: Insufficient documentation

## 2011-12-22 DIAGNOSIS — I10 Essential (primary) hypertension: Secondary | ICD-10-CM | POA: Insufficient documentation

## 2011-12-22 DIAGNOSIS — E119 Type 2 diabetes mellitus without complications: Secondary | ICD-10-CM | POA: Insufficient documentation

## 2011-12-22 DIAGNOSIS — F411 Generalized anxiety disorder: Secondary | ICD-10-CM | POA: Insufficient documentation

## 2011-12-22 DIAGNOSIS — R109 Unspecified abdominal pain: Secondary | ICD-10-CM | POA: Insufficient documentation

## 2011-12-22 LAB — LIPASE, BLOOD: Lipase: 68 U/L — ABNORMAL HIGH (ref 11–59)

## 2011-12-22 LAB — DIFFERENTIAL
Lymphocytes Relative: 53 % — ABNORMAL HIGH (ref 12–46)
Monocytes Absolute: 0.7 10*3/uL (ref 0.1–1.0)
Monocytes Relative: 17 % — ABNORMAL HIGH (ref 3–12)
Neutro Abs: 1.1 10*3/uL — ABNORMAL LOW (ref 1.7–7.7)

## 2011-12-22 LAB — COMPREHENSIVE METABOLIC PANEL
BUN: 10 mg/dL (ref 6–23)
CO2: 24 mEq/L (ref 19–32)
Chloride: 102 mEq/L (ref 96–112)
Creatinine, Ser: 1.03 mg/dL (ref 0.50–1.35)
GFR calc non Af Amer: 90 mL/min — ABNORMAL LOW (ref >90)
Total Bilirubin: 0.3 mg/dL (ref 0.3–1.2)

## 2011-12-22 LAB — CBC
HCT: 36.7 % — ABNORMAL LOW (ref 39.0–52.0)
Hemoglobin: 12.7 g/dL — ABNORMAL LOW (ref 13.0–17.0)
MCHC: 34.6 g/dL (ref 30.0–36.0)
WBC: 4.1 10*3/uL (ref 4.0–10.5)

## 2011-12-22 MED ORDER — GI COCKTAIL ~~LOC~~: 30.0000 mL | Freq: Once | ORAL | Status: DC

## 2011-12-22 MED ORDER — SODIUM CHLORIDE 0.9 % IV BOLUS (SEPSIS): 1000.0000 mL | Freq: Once | INTRAVENOUS | Status: DC

## 2011-12-22 MED ORDER — KETOROLAC TROMETHAMINE 30 MG/ML IJ SOLN
30.0000 mg | Freq: Once | INTRAMUSCULAR | Status: DC
  Filled 2011-12-22: qty 1

## 2011-12-22 NOTE — ED Notes (Signed)
Received pt. In room 11 via w/c pt. Alert and oriented, NAD noted, respirations even and regular
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
## 2011-12-22 NOTE — ED Provider Notes (Signed)
History     CSN: [id number]  Arrival date & time 12/22/11  0116   First MD Initiated Contact with Patient 12/22/11 [phone]      Chief Complaint  Patient presents with  . Abdominal Pain    (Consider location/radiation/quality/duration/timing/severity/associated sxs/prior treatment) HPI Comments: 41 year old male with a history of bipolar, schizoaffective disorder, pancreatitis, alcohol use and anxiety presents with a complaint of abdominal pain. According to the family member he had been drinking at least 224 ounce beers this evening and then developed worsening abdominal pain. He states that his belly pain has been going on for the better part of the week, it has been very mild and not worse with eating or drinking. His pain is currently periumbilical, the patient is difficult historian given his level of intoxication. - His friend states that he has had the occasional bowel movement with blood in it, he was evaluated for this greater than one year ago at an outside hospital without any definitive etiology. He believes he had something like this earlier in the week.  Review of systems Limited by patient's level of intoxication and uncooperative demeanor  Patient is a 41 y.o. male presenting with abdominal pain. The history is provided by the patient and a friend. The history is limited by the condition of the patient (Alcohol intoxication).  Abdominal Pain The primary symptoms of the illness include abdominal pain.    Past Medical History  Diagnosis Date  . Hypertension   . Schizoaffective disorder   . Bipolar 1 disorder   . Diabetes mellitus   . Pancreatitis   . Anxiety   . Panic attack     History reviewed. No pertinent past surgical history.  No family history on file.  History  Substance Use Topics  . Smoking status: Current Everyday Smoker    Types: Cigarettes  . Smokeless tobacco: Not on file  . Alcohol Use: Yes     several times a week      Review of Systems    Unable to perform ROS: Other  Gastrointestinal: Positive for abdominal pain.    Allergies  Review of patient's allergies indicates no known allergies.  Home Medications   Current Outpatient Rx  Name Route Sig Dispense Refill  . TRIAMCINOLONE ACETONIDE 0.1 % EX CREA Topical Apply topically 2 (two) times daily. 60 g 0    BP 167/103  Pulse 85  Temp(Src) 98.4 F (36.9 C) (Oral)  Resp 22  Ht 5\' 10"  (1.778 m)  Wt 175 lb (79.379 kg)  BMI 25.11 kg/m2  SpO2 98%  Physical Exam  Nursing note and vitals reviewed. Constitutional: He appears well-developed and well-nourished. No distress.  HENT:  Head: Normocephalic and atraumatic.  Mouth/Throat: Oropharynx is clear and moist. No oropharyngeal exudate.       Edentulous  Eyes: Conjunctivae and EOM are normal. Pupils are equal, round, and reactive to light. Right eye exhibits no discharge. Left eye exhibits no discharge. No scleral icterus.  Neck: Normal range of motion. Neck supple. No JVD present. No thyromegaly present.  Cardiovascular: Normal rate, regular rhythm, normal heart sounds and intact distal pulses.  Exam reveals no gallop and no friction rub.   No murmur heard. Pulmonary/Chest: Effort normal and breath sounds normal. No respiratory distress. He has no wheezes. He has no rales.  Abdominal: Soft. Bowel sounds are normal. He exhibits no distension and no mass. There is tenderness ( Periumbilical tenderness, patient has very soft abdomen, guarding to the periumbilical area. No masses palpated,  no pulsating mass, no hernia).  Genitourinary:       Rectal exam with normal tone, no hemorrhoids, no fissures, Hemoccult of the stool sample is negative for blood.  Musculoskeletal: Normal range of motion. He exhibits no edema and no tenderness.  Lymphadenopathy:    He has no cervical adenopathy.  Neurological: He is alert. Coordination normal.  Skin: Skin is warm and dry. No rash noted. No erythema.  Psychiatric: He has a normal mood  and affect. His behavior is normal.    ED Course  Procedures (including critical care time)  Labs Reviewed  ETHANOL - Abnormal; Notable for the following:    Alcohol, Ethyl (B) 276 (*)    All other components within normal limits  LIPASE, BLOOD - Abnormal; Notable for the following:    Lipase 68 (*)    All other components within normal limits  CBC - Abnormal; Notable for the following:    RBC 3.90 (*)    Hemoglobin 12.7 (*)    HCT 36.7 (*)    Platelets 144 (*)    All other components within normal limits  DIFFERENTIAL - Abnormal; Notable for the following:    Neutrophils Relative 28 (*)    Neutro Abs 1.1 (*)    Lymphocytes Relative 53 (*)    Monocytes Relative 17 (*)    All other components within normal limits  COMPREHENSIVE METABOLIC PANEL - Abnormal; Notable for the following:    AST 52 (*)    GFR calc non Af Amer 90 (*)    All other components within normal limits   No results found.   1. Abdominal  pain, other specified site   2. Alcohol intoxication       MDM  Abdominal pain, possible pancreatitis in a patient has heavy alcohol intake. He is currently unable to contribute much to his history, intoxicated clinically, check alcohol level, labs, fluids, will withhold opiate medications given the patient's mental status at this time. Intravenous Toradol given.   After patient given Toradol, labs drawn, alcohol level elevated at 276, no leukocytosis, no significant anemia and no elevation in liver function tests, lipase of 68 barely above normal. The patient did not seem to be in any acute distress, had a soft abdomen. Without informing staff the patient ambulated out of the emergency department with a person who accompanied him here.  Disposition:  eloped after physician evaluation     Vida Roller, MD 12/22/11 [phone]

## 2011-12-22 NOTE — ED Notes (Signed)
Pt reports onset of generalized abd pain tonight after drinking a beer

## 2011-12-22 NOTE — ED Notes (Signed)
Pt. Eloped, AMA

## 2012-03-01 ENCOUNTER — Emergency Department (HOSPITAL_COMMUNITY)
Admission: EM | Admit: 2012-03-01 | Discharge: 2012-03-01 | Disposition: A | Discharge status: Home / Self Care | Payer: Self-pay | Attending: Emergency Medicine | Admitting: Emergency Medicine

## 2012-03-01 ENCOUNTER — Encounter (HOSPITAL_COMMUNITY): Payer: Self-pay

## 2012-03-01 DIAGNOSIS — E119 Type 2 diabetes mellitus without complications: Secondary | ICD-10-CM | POA: Insufficient documentation

## 2012-03-01 DIAGNOSIS — Z760 Encounter for issue of repeat prescription: Secondary | ICD-10-CM | POA: Insufficient documentation

## 2012-03-01 DIAGNOSIS — I10 Essential (primary) hypertension: Secondary | ICD-10-CM | POA: Insufficient documentation

## 2012-03-01 DIAGNOSIS — M79609 Pain in unspecified limb: Secondary | ICD-10-CM | POA: Insufficient documentation

## 2012-03-01 MED ORDER — HYDROCODONE-ACETAMINOPHEN 5-325 MG PO TABS
1.0000 | ORAL_TABLET | Freq: Once | ORAL | Status: AC
  Administered 2012-03-01: 1 via ORAL
  Filled 2012-03-01: qty 1

## 2012-03-01 NOTE — ED Notes (Signed)
Strong odor of etoh on pt's breath.

## 2012-03-01 NOTE — ED Notes (Signed)
Pt has script filled today for his pain meds, bottle fell out of his pants --now needs vicodin for pain. Wants refill

## 2012-03-03 NOTE — ED Provider Notes (Signed)
History     CSN: [id number]  Arrival date & time 03/01/12  1709   First MD Initiated Contact with Patient 03/01/12 1717      Chief Complaint  Patient presents with  . Medication Refill    (Consider location/radiation/quality/duration/timing/severity/associated sxs/prior treatment) HPI Comments: [name] presents for request for refill of his vicodin prescription.  He was seen at [facility] yesterday and was diagnosed with an avulsion fracture of his right 3rd toe after dropping a heavy object on the toe.  He presents in a post op shoe and states he got his prescription for pain medicine filled today at [facility] and when he got outside,  It had fallen out of a hole in his pocket.  He requests a refill.  He did not search for the medicine nor did he notify the store of this missing bottle.  He has been instructed to follow up with an orthopedist in [place] in 2 days.    The history is provided by the patient.    Past Medical History  Diagnosis Date  . Hypertension   . Schizoaffective disorder   . Bipolar 1 disorder   . Diabetes mellitus   . Pancreatitis   . Anxiety   . Panic attack     History reviewed. No pertinent past surgical history.  No family history on file.  History  Substance Use Topics  . Smoking status: Current Everyday Smoker    Types: Cigarettes  . Smokeless tobacco: Not on file  . Alcohol Use: Yes     several times a week      Review of Systems  Musculoskeletal: Positive for joint swelling and arthralgias.  Skin: Negative for wound.  Neurological: Negative for weakness and numbness.    Allergies  Review of patient's allergies indicates no known allergies.  Home Medications   Current Outpatient Rx  Name Route Sig Dispense Refill  . TRIAMCINOLONE ACETONIDE 0.1 % EX CREA Topical Apply topically 2 (two) times daily. 60 g 0    BP 165/100  Pulse 113  Temp 98.2 F (36.8 C) (Oral)  Resp 20  Ht 5\' 10"  (1.778 m)  Wt 170 lb (77.111 kg)   BMI 24.39 kg/m2  SpO2 100%  Physical Exam  Constitutional: He appears well-developed and well-nourished.  HENT:  Head: Atraumatic.  Neck: Normal range of motion.  Cardiovascular:       Pulses equal bilaterally  Musculoskeletal: He exhibits tenderness. He exhibits no edema.       Feet:  Neurological: He is alert. He has normal strength. He displays normal reflexes. No sensory deficit.       Equal strength  Skin: Skin is warm and dry.  Psychiatric: He has a normal mood and affect.    ED Course  Procedures (including critical care time)  Labs Reviewed - No data to display No results found.   1. Encounter for medication refill       MDM  Pt instructed he needs to contact the original prescriber of the medicine for a refill.  He was also encouraged to contact [facility] to let them know there is a lost bottle of vicodin somewhere on their property.  He was given one tablet of vicodin prior to dc home.  Pt is not driving.  He does smell of etoh but does not appear to be intoxicated.  He is ambulatory and A & O x 3.        Burgess Amor, [place] 03/03/12 [phone]

## 2012-03-04 NOTE — ED Provider Notes (Signed)
Medical screening examination/treatment/procedure(s) were performed by non-physician practitioner and as supervising physician I was immediately available for consultation/collaboration.   Carleene Cooper III, MD 03/04/12 [phone]

## 2013-01-05 DIAGNOSIS — R0602 Shortness of breath: Secondary | ICD-10-CM

## 2013-10-10 ENCOUNTER — Encounter (HOSPITAL_COMMUNITY): Payer: Self-pay | Admitting: Emergency Medicine

## 2013-10-10 ENCOUNTER — Emergency Department (HOSPITAL_COMMUNITY): Payer: 59

## 2013-10-10 ENCOUNTER — Inpatient Hospital Stay (HOSPITAL_COMMUNITY)
Admission: EM | Admit: 2013-10-10 | Discharge: 2013-10-12 | DRG: 918 | Disposition: A | Discharge status: Home / Self Care | Payer: 59 | Attending: Internal Medicine | Admitting: Internal Medicine

## 2013-10-10 DIAGNOSIS — T405X1A Poisoning by cocaine, accidental (unintentional), initial encounter: Principal | ICD-10-CM

## 2013-10-10 DIAGNOSIS — D72829 Elevated white blood cell count, unspecified: Secondary | ICD-10-CM | POA: Diagnosis present

## 2013-10-10 DIAGNOSIS — F411 Generalized anxiety disorder: Secondary | ICD-10-CM | POA: Diagnosis present

## 2013-10-10 DIAGNOSIS — F1010 Alcohol abuse, uncomplicated: Secondary | ICD-10-CM | POA: Diagnosis present

## 2013-10-10 DIAGNOSIS — F410 Panic disorder [episodic paroxysmal anxiety] without agoraphobia: Secondary | ICD-10-CM | POA: Diagnosis present

## 2013-10-10 DIAGNOSIS — E119 Type 2 diabetes mellitus without complications: Secondary | ICD-10-CM

## 2013-10-10 DIAGNOSIS — F319 Bipolar disorder, unspecified: Secondary | ICD-10-CM | POA: Diagnosis present

## 2013-10-10 DIAGNOSIS — E872 Acidosis, unspecified: Secondary | ICD-10-CM

## 2013-10-10 DIAGNOSIS — I498 Other specified cardiac arrhythmias: Secondary | ICD-10-CM | POA: Diagnosis present

## 2013-10-10 DIAGNOSIS — F1410 Cocaine abuse, uncomplicated: Secondary | ICD-10-CM | POA: Diagnosis present

## 2013-10-10 DIAGNOSIS — Z720 Tobacco use: Secondary | ICD-10-CM | POA: Diagnosis present

## 2013-10-10 DIAGNOSIS — E876 Hypokalemia: Secondary | ICD-10-CM | POA: Diagnosis present

## 2013-10-10 DIAGNOSIS — F17200 Nicotine dependence, unspecified, uncomplicated: Secondary | ICD-10-CM | POA: Diagnosis present

## 2013-10-10 DIAGNOSIS — R002 Palpitations: Secondary | ICD-10-CM

## 2013-10-10 DIAGNOSIS — I10 Essential (primary) hypertension: Secondary | ICD-10-CM

## 2013-10-10 DIAGNOSIS — F259 Schizoaffective disorder, unspecified: Secondary | ICD-10-CM | POA: Diagnosis present

## 2013-10-10 DIAGNOSIS — R42 Dizziness and giddiness: Secondary | ICD-10-CM

## 2013-10-10 DIAGNOSIS — R000 Tachycardia, unspecified: Secondary | ICD-10-CM

## 2013-10-10 DIAGNOSIS — F419 Anxiety disorder, unspecified: Secondary | ICD-10-CM

## 2013-10-10 HISTORY — DX: Tobacco use: Z72.0

## 2013-10-10 HISTORY — DX: Other psychoactive substance abuse, uncomplicated: F19.10

## 2013-10-10 LAB — CBC WITH DIFFERENTIAL/PLATELET
Basophils Absolute: 0 10*3/uL (ref 0.0–0.1)
Basophils Relative: 0 % (ref 0–1)
Eosinophils Absolute: 0 10*3/uL (ref 0.0–0.7)
Eosinophils Relative: 0 % (ref 0–5)
HCT: 40.1 % (ref 39.0–52.0)
Hemoglobin: 13.7 g/dL (ref 13.0–17.0)
LYMPHS PCT: 11 % — AB (ref 12–46)
Lymphs Abs: 1.6 10*3/uL (ref 0.7–4.0)
MCH: 32.6 pg (ref 26.0–34.0)
MCHC: 34.2 g/dL (ref 30.0–36.0)
MCV: 95.5 fL (ref 78.0–100.0)
MONOS PCT: 7 % (ref 3–12)
Monocytes Absolute: 1 10*3/uL (ref 0.1–1.0)
NEUTROS ABS: 12.1 10*3/uL — AB (ref 1.7–7.7)
NEUTROS PCT: 83 % — AB (ref 43–77)
PLATELETS: 200 10*3/uL (ref 150–400)
RBC: 4.2 MIL/uL — ABNORMAL LOW (ref 4.22–5.81)
RDW: 12.2 % (ref 11.5–15.5)
WBC: 14.7 10*3/uL — AB (ref 4.0–10.5)

## 2013-10-10 LAB — COMPREHENSIVE METABOLIC PANEL
ALK PHOS: 88 U/L (ref 39–117)
ALT: 17 U/L (ref 0–53)
AST: 31 U/L (ref 0–37)
Albumin: 4 g/dL (ref 3.5–5.2)
BILIRUBIN TOTAL: 0.5 mg/dL (ref 0.3–1.2)
BUN: 9 mg/dL (ref 6–23)
CHLORIDE: 104 meq/L (ref 96–112)
CO2: 16 meq/L — AB (ref 19–32)
Calcium: 8.9 mg/dL (ref 8.4–10.5)
Creatinine, Ser: 1.16 mg/dL (ref 0.50–1.35)
GFR calc non Af Amer: 77 mL/min — ABNORMAL LOW (ref >90)
GFR, EST AFRICAN AMERICAN: 89 mL/min — AB (ref >90)
GLUCOSE: 112 mg/dL — AB (ref 70–99)
POTASSIUM: 3.5 meq/L — AB (ref 3.7–5.3)
SODIUM: 143 meq/L (ref 137–147)
TOTAL PROTEIN: 8.5 g/dL — AB (ref 6.0–8.3)

## 2013-10-10 LAB — CK: Total CK: 249 U/L — ABNORMAL HIGH (ref 7–232)

## 2013-10-10 LAB — ACETAMINOPHEN LEVEL

## 2013-10-10 LAB — POCT I-STAT TROPONIN I: Troponin i, poc: 0.09 ng/mL (ref 0.00–0.08)

## 2013-10-10 LAB — RAPID URINE DRUG SCREEN, HOSP PERFORMED
AMPHETAMINES: NOT DETECTED
Barbiturates: NOT DETECTED
Benzodiazepines: NOT DETECTED
Cocaine: POSITIVE — AB
Opiates: NOT DETECTED
Tetrahydrocannabinol: NOT DETECTED

## 2013-10-10 LAB — MRSA PCR SCREENING: MRSA BY PCR: NEGATIVE

## 2013-10-10 LAB — I-STAT TROPONIN, ED: TROPONIN I, POC: 0.02 ng/mL (ref 0.00–0.08)

## 2013-10-10 LAB — SALICYLATE LEVEL

## 2013-10-10 LAB — TROPONIN I: Troponin I: <0.3 ng/mL (ref <0.30)

## 2013-10-10 LAB — ETHANOL: ALCOHOL ETHYL (B): 19 mg/dL — AB (ref 0–11)

## 2013-10-10 LAB — I-STAT CG4 LACTIC ACID, ED: Lactic Acid, Venous: 7.01 mmol/L — ABNORMAL HIGH (ref 0.5–2.2)

## 2013-10-10 MED ORDER — POTASSIUM CHLORIDE IN NACL 20-0.45 MEQ/L-% IV SOLN
INTRAVENOUS | Status: AC
  Filled 2013-10-10: qty 1000

## 2013-10-10 MED ORDER — SODIUM CHLORIDE 0.45 % IV SOLN
INTRAVENOUS | Status: DC
  Administered 2013-10-10: 20:00:00 via INTRAVENOUS
  Filled 2013-10-10 (×4): qty 1000

## 2013-10-10 MED ORDER — CLONIDINE HCL 0.2 MG PO TABS
0.3000 mg | ORAL_TABLET | Freq: Once | ORAL | Status: AC
  Administered 2013-10-10: 0.3 mg via ORAL
  Filled 2013-10-10 (×2): qty 1

## 2013-10-10 MED ORDER — LORAZEPAM 2 MG/ML IJ SOLN
INTRAMUSCULAR | Status: AC
  Administered 2013-10-10: 2 mg
  Filled 2013-10-10: qty 1

## 2013-10-10 MED ORDER — THIAMINE HCL 100 MG/ML IJ SOLN
100.0000 mg | Freq: Every day | INTRAMUSCULAR | Status: DC
  Administered 2013-10-10: 100 mg via INTRAVENOUS
  Filled 2013-10-10: qty 2

## 2013-10-10 MED ORDER — HYDRALAZINE HCL 20 MG/ML IJ SOLN
5.0000 mg | INTRAMUSCULAR | Status: DC | PRN
  Administered 2013-10-11: 5 mg via INTRAVENOUS
  Filled 2013-10-10: qty 1

## 2013-10-10 MED ORDER — SODIUM BICARBONATE 8.4 % IV SOLN
INTRAVENOUS | Status: AC
  Filled 2013-10-10: qty 50

## 2013-10-10 MED ORDER — ACETAMINOPHEN 325 MG PO TABS
650.0000 mg | ORAL_TABLET | Freq: Four times a day (QID) | ORAL | Status: DC | PRN
  Administered 2013-10-10: 650 mg via ORAL
  Filled 2013-10-10: qty 2

## 2013-10-10 MED ORDER — POTASSIUM CHLORIDE CRYS ER 20 MEQ PO TBCR
30.0000 meq | EXTENDED_RELEASE_TABLET | Freq: Two times a day (BID) | ORAL | Status: AC
Start: 2013-10-10 — End: 2013-10-11
  Administered 2013-10-11 (×2): 30 meq via ORAL
  Filled 2013-10-10 (×2): qty 3

## 2013-10-10 MED ORDER — VITAMIN B-1 100 MG PO TABS
100.0000 mg | ORAL_TABLET | Freq: Every day | ORAL | Status: DC
  Administered 2013-10-11 – 2013-10-12 (×2): 100 mg via ORAL
  Filled 2013-10-10 (×2): qty 1

## 2013-10-10 MED ORDER — SODIUM BICARBONATE 8.4 % IV SOLN
INTRAVENOUS | Status: DC
  Filled 2013-10-10 (×4): qty 1000

## 2013-10-10 MED ORDER — ONDANSETRON HCL 4 MG/2ML IJ SOLN
INTRAMUSCULAR | Status: AC
  Administered 2013-10-10: 4 mg
  Filled 2013-10-10: qty 2

## 2013-10-10 MED ORDER — LORAZEPAM 0.5 MG PO TABS
1.0000 mg | ORAL_TABLET | Freq: Four times a day (QID) | ORAL | Status: DC
  Administered 2013-10-10 – 2013-10-11 (×2): 1 mg via ORAL
  Filled 2013-10-10 (×2): qty 2

## 2013-10-10 MED ORDER — TAB-A-VITE/IRON PO TABS
1.0000 | ORAL_TABLET | Freq: Every day | ORAL | Status: DC
  Administered 2013-10-11 – 2013-10-12 (×2): 1 via ORAL
  Filled 2013-10-10 (×3): qty 1

## 2013-10-10 MED ORDER — FAMOTIDINE 20 MG PO TABS
20.0000 mg | ORAL_TABLET | Freq: Two times a day (BID) | ORAL | Status: DC
  Administered 2013-10-11 – 2013-10-12 (×4): 20 mg via ORAL
  Filled 2013-10-10 (×4): qty 1

## 2013-10-10 MED ORDER — ACETAMINOPHEN 650 MG RE SUPP: 650.0000 mg | Freq: Four times a day (QID) | RECTAL | Status: DC | PRN

## 2013-10-10 MED ORDER — LORAZEPAM 2 MG/ML IJ SOLN
1.0000 mg | Freq: Once | INTRAMUSCULAR | Status: AC
  Administered 2013-10-10: 1 mg via INTRAVENOUS
  Filled 2013-10-10: qty 1

## 2013-10-10 MED ORDER — GUAIFENESIN-DM 100-10 MG/5ML PO SYRP
5.0000 mL | ORAL_SOLUTION | ORAL | Status: DC | PRN
  Administered 2013-10-11: 5 mL via ORAL
  Filled 2013-10-10: qty 5

## 2013-10-10 MED ORDER — SODIUM CHLORIDE 0.9 % IV BOLUS (SEPSIS)
2000.0000 mL | Freq: Once | INTRAVENOUS | Status: AC
  Administered 2013-10-10: 2000 mL via INTRAVENOUS

## 2013-10-10 MED ORDER — MAGNESIUM SULFATE 50 % IJ SOLN
1.0000 g | Freq: Once | INTRAVENOUS | Status: AC
  Administered 2013-10-11: 1 g via INTRAVENOUS
  Filled 2013-10-10: qty 2

## 2013-10-10 MED ORDER — ONDANSETRON HCL 4 MG/2ML IJ SOLN
4.0000 mg | Freq: Four times a day (QID) | INTRAMUSCULAR | Status: DC | PRN
  Administered 2013-10-10 – 2013-10-11 (×2): 4 mg via INTRAVENOUS
  Filled 2013-10-10 (×2): qty 2

## 2013-10-10 MED ORDER — ONDANSETRON HCL 4 MG PO TABS: 4.0000 mg | ORAL_TABLET | Freq: Four times a day (QID) | ORAL | Status: DC | PRN

## 2013-10-10 MED ORDER — NICOTINE 21 MG/24HR TD PT24
21.0000 mg | MEDICATED_PATCH | Freq: Every day | TRANSDERMAL | Status: DC
  Administered 2013-10-10 – 2013-10-12 (×3): 21 mg via TRANSDERMAL
  Filled 2013-10-10 (×3): qty 1

## 2013-10-10 MED ORDER — ASPIRIN EC 81 MG PO TBEC
81.0000 mg | DELAYED_RELEASE_TABLET | Freq: Every day | ORAL | Status: DC
  Administered 2013-10-10 – 2013-10-12 (×3): 81 mg via ORAL
  Filled 2013-10-10 (×3): qty 1

## 2013-10-10 MED ORDER — OXYCODONE HCL 5 MG PO TABS
5.0000 mg | ORAL_TABLET | ORAL | Status: DC | PRN
  Administered 2013-10-11 (×2): 5 mg via ORAL
  Filled 2013-10-10 (×2): qty 1

## 2013-10-10 MED ORDER — CLONIDINE HCL 0.2 MG PO TABS
0.3000 mg | ORAL_TABLET | Freq: Two times a day (BID) | ORAL | Status: DC
  Administered 2013-10-11: 0.3 mg via ORAL
  Filled 2013-10-10 (×2): qty 1

## 2013-10-10 NOTE — H&P (Signed)
[facility] History and Physical  Nathan SpringsCraig L Aydelotte EAV:[id number] DOB: 1970-08-24 DOA: 10/10/2013  Referring physician: ED physician, Dr. Clarene DukeMcManus PCP: No PCP Per Patient   Chief Complaint: Chest palpitations  HPI: Nathan SpringsCraig L Hoover is a 43 y.o. male with a history of hypertension, diet-controlled diabetes mellitus, bipolar disorder, and chronic anxiety, who [facility] his the [facility] jail with a chief complaint of chest palpitations and transient chest pain. Last night/early this morning, the patient was doing cocaine with friends. While driving, they were stopped by the police. Apparently, the patient had a warrant out for his arrest for " failure to comply". He apparently swallowed a small bag of cocaine he had trying to hide it from the police. He denies suicidal intent. He denies trying to harm himself. He just wanted to hide the cocaine from the police. During his stay at the jail, he had been without any complaints. However, this afternoon, he developed a sudden onset of chest palpitations, transient chest pain, generalized weakness, sweatiness, and lightheadedness. EMS was called. Reportedly, the patient had narrow complex supraventricular tachycardia and was given adenosine and diltiazem. When he arrived to the emergency department, his heart rate was in the 140s and his blood pressure was ranging from 195-220 systolically. He was given a 2 L bolus of normal saline, clonidine, and several milligrams of Ativan. His heart rate improved to the 100s to 120s. His palpitations resolved. Currently, he is without complaints. No history of abdominal pain, nausea, or vomiting. He was lost to followup for treatment of bipolar disorder and anxiety. He denies any recent manic episodes or severe depression. He does have occasional anxiety, but he drinks beer to help with this.  Currently, his blood pressure is 179/141. He is oxygenating 99% on room air. His EKG reveals sinus tachycardia with a heart rate  of 130 beats per minute, and LVH. His chest x-ray reveals mild hyperinflation. His white blood cell count is elevated at 14.7, potassium is 3.5, C02 is 16, and glucose is 112. He is being admitted for further evaluation and management    Review of Systems:  As above in history present illness.     Past Medical History  Diagnosis Date  . Hypertension   . Schizoaffective disorder   . Bipolar 1 disorder   . Diabetes mellitus   . Pancreatitis   . Anxiety   . Panic attack   . Tobacco abuse   . Polysubstance abuse     Cocaine and alcohol   History reviewed. No pertinent past surgical history. Social History:  Single. 2 children. Works in [employer]. Smokes 1ppd of cigarettes daily. Snorts cocaine 3x weekly. Drinks 6-7, 25 oz beers daily. Denies any other drug use.  No Known Allergies  Family history: The health of his father is unknown. His mother is in her 10770s. She has hypertension.   Prior to Admission medications   Not on File   Physical Exam: Filed Vitals:   10/10/13 1820  BP: 179/141  Pulse:   Temp:   Resp: 18    BP 179/141  Pulse 99  Temp(Src) 98.7 F (37.1 C) (Oral)  Resp 18  SpO2 99%  General:  Appears calm and comfortable Eyes: PERRL, normal lids, irises & conjunctiva ENT: He has many missing teeth. His mucous membranes are mildly dry. No posterior exudates or erythema. Neck: no LAD, masses or thyromegaly Cardiovascular: S1, S2, with tachycardia. Telemetry: Sinus tachycardia with LVH.  Respiratory: CTA bilaterally, no w/r/r. Normal respiratory effort. Abdomen: soft,  ntnd Skin: no rash or induration seen on limited exam Musculoskeletal: grossly normal tone BUE/BLE Psychiatric: He is alert and oriented x3. His speech is clear. He has a pleasant affect. No signs of tremulousness, but he appears slightly anxious. He denies suicidal ideation or any intent to harm himself. He denies visual or auditory hallucinations. His speech is not pressured. Neurologic:  grossly non-focal.cranial nerves II through XII are intact.           Labs on Admission:  Basic Metabolic Panel:  Recent Labs Lab 10/10/13 1510  NA 143  K 3.5*  CL 104  CO2 16*  GLUCOSE 112*  BUN 9  CREATININE 1.16  CALCIUM 8.9   Liver Function Tests:  Recent Labs Lab 10/10/13 1510  AST 31  ALT 17  ALKPHOS 88  BILITOT 0.5  PROT 8.5*  ALBUMIN 4.0   No results found for this basename: LIPASE, AMYLASE,  in the last 168 hours No results found for this basename: AMMONIA,  in the last 168 hours CBC:  Recent Labs Lab 10/10/13 1510  WBC 14.7*  NEUTROABS 12.1*  HGB 13.7  HCT 40.1  MCV 95.5  PLT 200   Cardiac Enzymes:  Recent Labs Lab 10/10/13 1554  CKTOTAL 249*    BNP (last 3 results) No results found for this basename: PROBNP,  in the last 8760 hours CBG: No results found for this basename: GLUCAP,  in the last 168 hours  Radiological Exams on Admission: Dg Chest Port 1 View  10/10/2013   CLINICAL DATA:  Vomiting  EXAM: PORTABLE CHEST - 1 VIEW  COMPARISON:  None.  FINDINGS: The lungs are mildly hyperinflated but clear. The cardiopericardial silhouette is normal in size. External pacemaker pads are present. The pulmonary vascularity is not engorged. There is no pleural effusion or pneumothorax. The mediastinum is normal in width. The observed portions of the bony thorax exhibit no acute abnormalities.  IMPRESSION: There is mild hyperinflation which may be voluntary. There is no evidence of active cardiopulmonary disease.   Electronically Signed   By: David  [place]   On: 10/10/2013 15:29    EKG: Independently reviewed. Sinus tachycardia with a heart rate of 130 beats per minute, and LVH.  Assessment/Plan Principal Problem:   Cocaine overdose Active Problems:   Accelerated hypertension   Sinus tachycardia   Heart palpitations   Lactic acidosis   Tobacco abuse   Alcohol abuse   Chronic anxiety   Diabetes mellitus   1. Cocaine abuse. There was no  intention to harm himself. Apparently, the patient was attempting to high the cocaine from the police officer. 2. Accelerated hypertension and sinus tachycardia. Per the report of an EDP who received the report from EMS, the patient was given several doses of adenosine and diltiazem for treatment of narrow complex supraventricular tachycardia prior to his arrival to the ED. He received 0.3 mg of clonidine in the ED. Currently, he is tachycardic and hypertensive which are obviously the effects of cocaine. He denies chest pain. His EKG reveals LVH and sinus tachycardia, with no suspicious ST or T wave abnormalities. 3. Lactic acidosis, likely secondary to accelerated hypertension and tachycardia. His total CK is only marginally elevated. Will treat with a gentle bicarbonate drip/IV fluid hydration. 4. Polysubstance abuse including alcohol abuse, cocaine abuse, and tobacco abuse. The patient was strongly advised to seek help to stop all 3. 5. History of bipolar disorder and chronic anxiety. He had been treated with Lexapro and Depakote in the past. He  did not followup with his primary psychiatrist on a regular basis. Currently, he is on no chronic medications. 6. Diet controlled diabetes mellitus. His blood glucose is marginally elevated. 7. Mild hypokalemia. This will be repleted. We'll give him 1 g of magnesium sulfate empirically. 8. Leukocytosis, likely secondary to the demargination from malignant hypertension and tachycardia.      Plan: Admit to the step down unit 1. Start half-normal saline with an amp of bicarbonate to treat lactic acidosis. 2. Give 1 g of magnesium sulfate empirically. Add potassium to the IV fluids and supplement potassium orally. 3. Start Ativan 1 mg 4 times a day scheduled. We'll also provide vitamin supplementation with multivitamin and thiamine. 4. We'll start clonidine 0.3 mg twice a day and add when necessary hydralazine. 5. We'll place a nicotine patch. 6. We'll  order tobacco cessation counseling. Patient was advised to seek help for stopping cocaine and alcohol. 7. Followup on laboratory studies including basic metabolic panel, CBC, magnesium level, TSH, lactic acid, hemoglobin A1c.    Code Status: Full code Family Communication: Family not available Disposition Plan: Discharge to home when clinically appropriate  Time spent:  One hour  [facility] Pager [phone]

## 2013-10-10 NOTE — ED Notes (Signed)
Resuscitation pads placed on pt for precaution.

## 2013-10-10 NOTE — ED Notes (Signed)
Pt states "I swallowed a bag of cocaine last night when the police came".

## 2013-10-10 NOTE — Progress Notes (Signed)
ED/CM noted patient did not have health insurance and/or PCP listed in the computer.  Patient is not able to communicate regarding referral information at this time.

## 2013-10-10 NOTE — ED Provider Notes (Signed)
CSN: [id number]     Arrival date & time 10/10/13  1441 History   First MD Initiated Contact with Patient 10/10/13 1448     Chief Complaint  Patient presents with  . Tachycardia  . Chest Pain     (Consider location/radiation/quality/duration/timing/severity/associated sxs/prior Treatment) HPI The patient denies chest pain or shortness to me. 43 year old male with history of anxiety panic attacks diabetes schizoaffective disorder who states he was getting high on cocaine overnight when the police were called to a disturbance at his location and he was arrested due to failure to comply with prior legal directions. The police state the patient was fine during breakfast or lunch today the jail eating walking talking normally without difficulty. This afternoon just prior to arrival the patient developed sudden onset palpitations tachycardia generalized weakness presyncope sweats nausea is found to be in narrow complex supraventricular tachycardia by EMS given adenosine and diltiazem prior to arrival. Apparently there may have been chest pain prior to arrival the patient denies having any chest pain now. He denies any shortness of breath. He states he didn't really have chest pain prior to arrival he just felt as if his heart was racing. He states he believes the symptoms are because he swallowed a bag of cocaine last night to hide it from the police. He denies any threats or hurt himself or others he denies suicidal homicidal ideation denies hallucinations denies syncope denies confusion denies abdominal pain. He did vomit once upon arrival to the emergency department nonbloody emesis. This started within an hour prior to arrival. Past Medical History  Diagnosis Date  . Hypertension   . Schizoaffective disorder   . Bipolar 1 disorder   . Diabetes mellitus   . Pancreatitis   . Anxiety   . Panic attack   . Tobacco abuse   . Polysubstance abuse     Cocaine and alcohol   History reviewed. No pertinent  past surgical history. History reviewed. No pertinent family history. History  Substance Use Topics  . Smoking status: Current Every Day Smoker    Types: Cigarettes  . Smokeless tobacco: Current User  . Alcohol Use: Yes     Comment: several times a week    Review of Systems 10 Systems reviewed and are negative for acute change except as noted in the HPI.   Allergies  Review of patient's allergies indicates no known allergies.  Home Medications  No current outpatient prescriptions on file. BP 129/85  Pulse 58  Temp(Src) 98.1 F (36.7 C) (Oral)  Resp 13  Ht 5\' 10"  (1.778 m)  Wt 168 lb 14 oz (76.6 kg)  BMI 24.23 kg/m2  SpO2 100% Physical Exam  Nursing note and vitals reviewed. Constitutional: He is oriented to person, place, and time.  Awake, alert, nontoxic appearance.  HENT:  Head: Atraumatic.  Mouth/Throat: Oropharynx is clear and moist.  Eyes: Right eye exhibits no discharge. Left eye exhibits no discharge.  Neck: Neck supple.  Cardiovascular: Regular rhythm.   No murmur heard. Tachycardic  Pulmonary/Chest: Effort normal and breath sounds normal. No respiratory distress. He has no wheezes. He has no rales. He exhibits no tenderness.  Abdominal: Soft. Bowel sounds are normal. He exhibits no distension and no mass. There is no tenderness. There is no rebound and no guarding.  Musculoskeletal: He exhibits no edema and no tenderness.  Baseline ROM, no obvious new focal weakness.  Neurological: He is alert and oriented to person, place, and time.  Mental status and motor strength appears  baseline for patient and situation.  Skin: No rash noted.  Psychiatric:  Anxious    ED Course  Procedures (including critical care time) Patient / Family / Caregiver understand and agree with initial ED impression and plan with expectations set for ED visit. P 110 feels much better; 1705 D/w [facility] for admit. 1715 Patient / Caregiver informed of clinical course, understand medical  decision-making process, and agree with plan. Feel Obs reasonable to r/o MI and recheck lactate/anion gap.  Labs Review Labs Reviewed  CBC WITH DIFFERENTIAL - Abnormal; Notable for the following:    WBC 14.7 (*)    RBC 4.20 (*)    Neutrophils Relative % 83 (*)    Neutro Abs 12.1 (*)    Lymphocytes Relative 11 (*)    All other components within normal limits  COMPREHENSIVE METABOLIC PANEL - Abnormal; Notable for the following:    Potassium 3.5 (*)    CO2 16 (*)    Glucose, Bld 112 (*)    Total Protein 8.5 (*)    GFR calc non Af Amer 77 (*)    GFR calc Af Amer 89 (*)    All other components within normal limits  URINE RAPID DRUG SCREEN (HOSP PERFORMED) - Abnormal; Notable for the following:    Cocaine POSITIVE (*)    All other components within normal limits  ETHANOL - Abnormal; Notable for the following:    Alcohol, Ethyl (B) 19 (*)    All other components within normal limits  SALICYLATE LEVEL - Abnormal; Notable for the following:    Salicylate Lvl <2.0 (*)    All other components within normal limits  CK - Abnormal; Notable for the following:    Total CK 249 (*)    All other components within normal limits  COMPREHENSIVE METABOLIC PANEL - Abnormal; Notable for the following:    Sodium 134 (*)    Glucose, Bld 103 (*)    All other components within normal limits  CBC - Abnormal; Notable for the following:    RBC 3.93 (*)    Hemoglobin 12.7 (*)    HCT 37.0 (*)    All other components within normal limits  I-STAT CG4 LACTIC ACID, ED - Abnormal; Notable for the following:    Lactic Acid, Venous 7.01 (*)    All other components within normal limits  POCT I-STAT TROPONIN I - Abnormal; Notable for the following:    Troponin i, poc 0.09 (*)    All other components within normal limits  MRSA PCR SCREENING  ACETAMINOPHEN LEVEL  MAGNESIUM  TROPONIN I  TROPONIN I  TROPONIN I  LACTIC ACID, PLASMA  TSH  HEMOGLOBIN A1C  I-STAT TROPOININ, ED  I-STAT TROPOININ, ED  Rosezena Sensor, ED   Imaging Review Dg Chest Port 1 View  10/10/2013   CLINICAL DATA:  Vomiting  EXAM: PORTABLE CHEST - 1 VIEW  COMPARISON:  None.  FINDINGS: The lungs are mildly hyperinflated but clear. The cardiopericardial silhouette is normal in size. External pacemaker pads are present. The pulmonary vascularity is not engorged. There is no pleural effusion or pneumothorax. The mediastinum is normal in width. The observed portions of the bony thorax exhibit no acute abnormalities.  IMPRESSION: There is mild hyperinflation which may be voluntary. There is no evidence of active cardiopulmonary disease.   Electronically Signed   By: David  [place]   On: 10/10/2013 15:29     EKG Interpretation   Date/Time:  Saturday October 10 2013 15:38:48 EST Ventricular Rate:  130 PR Interval:  154 QRS Duration: 94 QT Interval:  300 QTC Calculation: 441 R Axis:   64 Text Interpretation:  Sinus tachycardia Left ventricular hypertrophy When  compared with ECG of 10-Oct-2013 14:44, No significant change was found  Confirmed by [facility]  MD, Jonny Ruiz ([id number]) on 10/10/2013 4:00:56 PM      ECG Repeat 1444: Sinus tachycardia, ventricular rate 130, left ventricular hypertrophy, no significant change compared with ECG upon arrival  MDM   Final diagnoses:  Cocaine overdose    The patient appears reasonably stabilized for admission considering the current resources, flow, and capabilities available in the ED at this time, and I doubt any other [facility] requiring further screening and/or treatment in the ED prior to admission.    Hurman Horn, MD 10/11/13 1356

## 2013-10-10 NOTE — ED Notes (Signed)
Pt reports used cocaine all night last night and was arrested early this morning.  EMS reports pt c/o chest pain while in jail.  EMS arrived to find pt in SVT.  They administered 6mg  adenosine without relief and then administered 12mg  x 2 without relief.  EMS also gave 20mg  cardizem.  Reports pt was extremely hypertensive.  Pt has 16g IV in left ac.  Pt repeatedly stating "pump my system,  i'm fixing to pass, I'm fixing to have a heart attack ."  Dr. Fonnie JarvisBednar aware.   Pt says chest still hurts at this time.  Pt says will not talk anymore right now.

## 2013-10-11 ENCOUNTER — Other Ambulatory Visit: Payer: Self-pay

## 2013-10-11 ENCOUNTER — Inpatient Hospital Stay (HOSPITAL_COMMUNITY): Payer: 59

## 2013-10-11 DIAGNOSIS — F17200 Nicotine dependence, unspecified, uncomplicated: Secondary | ICD-10-CM

## 2013-10-11 DIAGNOSIS — R42 Dizziness and giddiness: Secondary | ICD-10-CM

## 2013-10-11 LAB — COMPREHENSIVE METABOLIC PANEL
ALT: 14 U/L (ref 0–53)
AST: 30 U/L (ref 0–37)
Albumin: 3.6 g/dL (ref 3.5–5.2)
Alkaline Phosphatase: 72 U/L (ref 39–117)
BILIRUBIN TOTAL: 0.9 mg/dL (ref 0.3–1.2)
BUN: 7 mg/dL (ref 6–23)
CHLORIDE: 99 meq/L (ref 96–112)
CO2: 24 mEq/L (ref 19–32)
CREATININE: 0.95 mg/dL (ref 0.50–1.35)
Calcium: 8.9 mg/dL (ref 8.4–10.5)
GFR calc Af Amer: >90 mL/min (ref >90)
GFR calc non Af Amer: >90 mL/min (ref >90)
Glucose, Bld: 103 mg/dL — ABNORMAL HIGH (ref 70–99)
Potassium: 4 mEq/L (ref 3.7–5.3)
SODIUM: 134 meq/L — AB (ref 137–147)
Total Protein: 7.6 g/dL (ref 6.0–8.3)

## 2013-10-11 LAB — CBC
HCT: 37 % — ABNORMAL LOW (ref 39.0–52.0)
HEMOGLOBIN: 12.7 g/dL — AB (ref 13.0–17.0)
MCH: 32.3 pg (ref 26.0–34.0)
MCHC: 34.3 g/dL (ref 30.0–36.0)
MCV: 94.1 fL (ref 78.0–100.0)
PLATELETS: 169 10*3/uL (ref 150–400)
RBC: 3.93 MIL/uL — ABNORMAL LOW (ref 4.22–5.81)
RDW: 11.9 % (ref 11.5–15.5)
WBC: 8.4 10*3/uL (ref 4.0–10.5)

## 2013-10-11 LAB — HEMOGLOBIN A1C
Hgb A1c MFr Bld: 5.4 % (ref <5.7)
MEAN PLASMA GLUCOSE: 108 mg/dL (ref <117)

## 2013-10-11 LAB — TROPONIN I
Troponin I: <0.3 ng/mL (ref <0.30)
Troponin I: <0.3 ng/mL (ref <0.30)

## 2013-10-11 LAB — TSH: TSH: 0.533 u[IU]/mL (ref 0.350–4.500)

## 2013-10-11 LAB — MAGNESIUM: Magnesium: 2.4 mg/dL (ref 1.5–2.5)

## 2013-10-11 LAB — LACTIC ACID, PLASMA: LACTIC ACID, VENOUS: 0.8 mmol/L (ref 0.5–2.2)

## 2013-10-11 MED ORDER — MECLIZINE HCL 12.5 MG PO TABS: 12.5000 mg | ORAL_TABLET | Freq: Once | ORAL | Status: DC

## 2013-10-11 MED ORDER — MAGNESIUM SULFATE IN D5W 10-5 MG/ML-% IV SOLN
INTRAVENOUS | Status: AC
  Filled 2013-10-11: qty 100

## 2013-10-11 MED ORDER — LORAZEPAM 0.5 MG PO TABS: 0.5000 mg | ORAL_TABLET | Freq: Four times a day (QID) | ORAL | Status: DC | PRN

## 2013-10-11 MED ORDER — CLONIDINE HCL 0.2 MG PO TABS: 0.2000 mg | ORAL_TABLET | Freq: Two times a day (BID) | ORAL | Status: DC

## 2013-10-11 MED ORDER — CLONIDINE HCL 0.1 MG PO TABS
0.1000 mg | ORAL_TABLET | Freq: Two times a day (BID) | ORAL | Status: DC
  Administered 2013-10-11: 0.1 mg via ORAL
  Filled 2013-10-11: qty 1

## 2013-10-11 MED ORDER — CLONIDINE HCL 0.1 MG PO TABS
0.1000 mg | ORAL_TABLET | Freq: Four times a day (QID) | ORAL | Status: DC | PRN
  Administered 2013-10-12: 0.1 mg via ORAL
  Filled 2013-10-11: qty 1

## 2013-10-11 MED ORDER — MECLIZINE HCL 12.5 MG PO TABS
12.5000 mg | ORAL_TABLET | Freq: Three times a day (TID) | ORAL | Status: DC | PRN
  Administered 2013-10-11: 12.5 mg via ORAL
  Filled 2013-10-11: qty 1

## 2013-10-11 NOTE — Progress Notes (Signed)
[facility] PROGRESS NOTE  Nathan SpringsCraig L Hoover NWG:[id number] DOB: 1970/09/12 DOA: 10/10/2013 PCP: No PCP Per Patient    Code Status: Full code Family Communication: Discuss with family in the room Disposition Plan: Likely discharge tomorrow   Consultants:  None  Procedures:  None  Antibiotics:  None  HPI/Subjective: (The patient was seen earlier this morning and later this afternoon for followup assessment). The patient has complained of intermittent dizziness throughout the day. It is mostly when he stands up or sits up. He denies headache, slurred speech, visual changes, or current chest pain. He denies feeling nervous or anxious. He denies pain with urination. He denies productive cough. He denies diarrhea.  Objective: Filed Vitals:   10/11/13 1800  BP: 118/76  Pulse: 69  Temp:   Resp: 18   temperature 98.3. Oxygen saturation 100%.  Intake/Output Summary (Last 24 hours) at 10/11/13 1855 Last data filed at 10/11/13 1009  Gross per 24 hour  Intake   1530 ml  Output   1650 ml  Net   -120 ml   Filed Weights   10/10/13 1916 10/11/13 0500  Weight: 76.6 kg (168 lb 14 oz) 76.6 kg (168 lb 14 oz)    Exam:   General:  43 year old African/American man sitting up in bed, in no acute distress.  Cardiovascular: S1, S2, with a 1-2/6 systolic murmur.  Respiratory: Occasional crackles auscultated in the bases, otherwise clear. Breathing is nonlabored.  Abdomen: Positive bowel sounds, soft, nontender, nondistended.  Musculoskeletal: No acute hot red joints.  Psychiatric/neurologic: He is alert and oriented x3. Cranial nerves II through XII are intact. No evidence of nystagmus. When he stands, he has mild unsteadiness, but he recovers.  Data Reviewed: Basic Metabolic Panel:  Recent Labs Lab 10/10/13 1510 10/11/13 0259  NA 143 134*  K 3.5* 4.0  CL 104 99  CO2 16* 24  GLUCOSE 112* 103*  BUN 9 7  CREATININE 1.16 0.95  CALCIUM 8.9 8.9  MG  --  2.4   Liver  Function Tests:  Recent Labs Lab 10/10/13 1510 10/11/13 0259  AST 31 30  ALT 17 14  ALKPHOS 88 72  BILITOT 0.5 0.9  PROT 8.5* 7.6  ALBUMIN 4.0 3.6   No results found for this basename: LIPASE, AMYLASE,  in the last 168 hours No results found for this basename: AMMONIA,  in the last 168 hours CBC:  Recent Labs Lab 10/10/13 1510 10/11/13 0259  WBC 14.7* 8.4  NEUTROABS 12.1*  --   HGB 13.7 12.7*  HCT 40.1 37.0*  MCV 95.5 94.1  PLT 200 169   Cardiac Enzymes:  Recent Labs Lab 10/10/13 1554 10/10/13 2118 10/11/13 0259 10/11/13 0851  CKTOTAL 249*  --   --   --   TROPONINI  --  <0.30 <0.30 <0.30   BNP (last 3 results) No results found for this basename: PROBNP,  in the last 8760 hours CBG: No results found for this basename: GLUCAP,  in the last 168 hours  Recent Results (from the past 240 hour(s))  MRSA PCR SCREENING     Status: None   Collection Time    10/10/13  9:30 PM      Result Value Ref Range Status   MRSA by PCR NEGATIVE  NEGATIVE Final   Comment:            The GeneXpert MRSA Assay (FDA     approved for NASAL specimens     only), is one component of a  comprehensive MRSA colonization     surveillance program. It is not     intended to diagnose MRSA     infection nor to guide or     monitor treatment for     MRSA infections.     Studies: Ct Head Wo Contrast  10/11/2013   CLINICAL DATA:  Dizziness, facial numbness.  EXAM: CT HEAD WITHOUT CONTRAST  TECHNIQUE: Contiguous axial images were obtained from the base of the skull through the vertex without intravenous contrast.  COMPARISON:  CT scan of May 27, 2011.  FINDINGS: Bony calvarium appears intact. No mass effect or midline shift is noted. Ventricular size is within normal limits. There is no evidence of mass lesion, hemorrhage or acute infarction.  IMPRESSION: No gross intracranial abnormality seen.   Electronically Signed   By: Roque Lias M.D.   On: 10/11/2013 17:08   Dg Chest Port 1  View  10/10/2013   CLINICAL DATA:  Vomiting  EXAM: PORTABLE CHEST - 1 VIEW  COMPARISON:  None.  FINDINGS: The lungs are mildly hyperinflated but clear. The cardiopericardial silhouette is normal in size. External pacemaker pads are present. The pulmonary vascularity is not engorged. There is no pleural effusion or pneumothorax. The mediastinum is normal in width. The observed portions of the bony thorax exhibit no acute abnormalities.  IMPRESSION: There is mild hyperinflation which may be voluntary. There is no evidence of active cardiopulmonary disease.   Electronically Signed   By: David  [place]   On: 10/10/2013 15:29    Scheduled Meds: . aspirin EC  81 mg Oral Daily  . cloNIDine  0.1 mg Oral BID  . famotidine  20 mg Oral BID  . meclizine  12.5 mg Oral Once  . multivitamins with iron  1 tablet Oral Daily  . nicotine  21 mg Transdermal Daily  . thiamine  100 mg Oral Daily   Continuous Infusions: . sodium chloride 0.45 % 1,000 mL with potassium chloride 20 mEq, sodium bicarbonate 50 mEq infusion 10 mL/hr at 10/11/13 1009   Assessment and plan:  Principal Problem:   Cocaine overdose Active Problems:   Accelerated hypertension   Sinus tachycardia   Heart palpitations   Lactic acidosis   Tobacco abuse   Alcohol abuse   Chronic anxiety   Diabetes mellitus   Dizziness  43 year old man who ingested cocaine to hide it from the police, who was admitted for an intentional cocaine overdose, accelerated hypertension, sinus tachycardia, and lactic acidosis. His blood pressure has decreased significantly on clonidine. He is no longer tachycardic. He has had symptomatic dizziness throughout the day, which could be secondary to the wide swings in his blood pressure and/or scheduled Ativan he had been receiving earlier.. CT scan of his head was ordered for evaluation and it do not reveal acute intracranial findings. His lactic acidosis has completely resolved with IV fluid hydration and mild  bicarbonate infusion. Although he abuses cocaine and alcohol, he does not appear to be tremulous. He was again advised to stop drinking and abusing cocaine. A nicotine patch has been placed and this will be continued. Tobacco cessation counseling was ordered. His followup EKG reveals prolonged QTC, but with normal sinus rhythm and no ST or T wave changes and no tachyarrhythmias. He did receive 1 g of magnesium sulfate empirically on 10/10/13. His magnesium level this morning was within normal limits. His troponin I was negative x4. Hyperglycemia has resolved.   Plan:  1. Will restart Ativan when necessary only. 2. Will  decrease clonidine to 0.1 mg every 6 hours, when necessary for heart rate of greater than 120 and a blood pressure of greater than 160 systolically. 3. We'll order orthostatic vital signs. 4. [place] medical social worker to assist the patient with problems to help with sobriety from alcohol and cocaine. 5. We'll check laboratory studies in the morning. We'll check the results of the TSH and hemoglobin A1c was pending. We'll order a urinalysis. 6. When necessary meclizine.      Time spent: Total time 45 minutes.    [facility]  [facility] Pager [phone]. If 7PM-7AM, please contact night-coverage at [email or web address], password [facility] 10/11/2013, 6:55 PM  LOS: 1 day

## 2013-10-11 NOTE — Progress Notes (Signed)
Utilization Review Completed.   Aaria Happ, RN, BSN Nurse Case Manager  

## 2013-10-11 NOTE — Progress Notes (Signed)
Assisted patient OOB to recliner. Patient persistantly c/o vertigo. States he feels like the chair is moving. Dr. Sherrie MustacheFisher notified, orders received.

## 2013-10-11 NOTE — Progress Notes (Signed)
Do difficulty ambulating. Denies any dizziness while ambulating. VSS.

## 2013-10-12 ENCOUNTER — Encounter (HOSPITAL_COMMUNITY): Payer: Self-pay | Admitting: Internal Medicine

## 2013-10-12 DIAGNOSIS — I498 Other specified cardiac arrhythmias: Secondary | ICD-10-CM

## 2013-10-12 LAB — CBC
HCT: 38.4 % — ABNORMAL LOW (ref 39.0–52.0)
Hemoglobin: 13 g/dL (ref 13.0–17.0)
MCH: 32.3 pg (ref 26.0–34.0)
MCHC: 33.9 g/dL (ref 30.0–36.0)
MCV: 95.5 fL (ref 78.0–100.0)
PLATELETS: 170 10*3/uL (ref 150–400)
RBC: 4.02 MIL/uL — ABNORMAL LOW (ref 4.22–5.81)
RDW: 12.1 % (ref 11.5–15.5)
WBC: 5.1 10*3/uL (ref 4.0–10.5)

## 2013-10-12 LAB — COMPREHENSIVE METABOLIC PANEL
ALK PHOS: 75 U/L (ref 39–117)
ALT: 15 U/L (ref 0–53)
AST: 28 U/L (ref 0–37)
Albumin: 3.5 g/dL (ref 3.5–5.2)
BUN: 9 mg/dL (ref 6–23)
CHLORIDE: 101 meq/L (ref 96–112)
CO2: 24 meq/L (ref 19–32)
CREATININE: 1.1 mg/dL (ref 0.50–1.35)
Calcium: 9.1 mg/dL (ref 8.4–10.5)
GFR calc Af Amer: >90 mL/min (ref >90)
GFR, EST NON AFRICAN AMERICAN: 82 mL/min — AB (ref >90)
Glucose, Bld: 92 mg/dL (ref 70–99)
POTASSIUM: 3.9 meq/L (ref 3.7–5.3)
Sodium: 137 mEq/L (ref 137–147)
Total Bilirubin: 0.6 mg/dL (ref 0.3–1.2)
Total Protein: 7.6 g/dL (ref 6.0–8.3)

## 2013-10-12 LAB — VITAMIN B12: VITAMIN B 12: 371 pg/mL (ref 211–911)

## 2013-10-12 MED ORDER — HYDROCHLOROTHIAZIDE 12.5 MG PO CAPS: 12.5000 mg | ORAL_CAPSULE | Freq: Every day | ORAL | Status: DC

## 2013-10-12 MED ORDER — THIAMINE HCL 100 MG PO TABS: 100.0000 mg | ORAL_TABLET | Freq: Every day | ORAL | Status: AC

## 2013-10-12 MED ORDER — TAB-A-VITE/IRON PO TABS: 1.0000 | ORAL_TABLET | Freq: Every day | ORAL | Status: AC

## 2013-10-12 NOTE — Care Management Note (Signed)
    Page 1 of 1   10/12/2013     11:14:12 AM   CARE MANAGEMENT NOTE 10/12/2013  Patient:  Joannie SpringsFOUNTAIN,Kayin L   Account Number:  [id number]  Date Initiated:  10/12/2013  Documentation initiated by:  Sharrie RothmanBLACKWELL,Karessa Onorato C  Subjective/Objective Assessment:   Pt admitted from home with CP. Pt lives with family and will return home at discharge. Pt is independent with ADL's.     Action/Plan:   PCP arranged with [facility] and documented on AVS. Financial counselor is aware of self pay status. No other CM needs noted. CSW has consulted with pt on substance abuse. Pt d/c today.   Anticipated DC Date:  10/12/2013   Anticipated DC Plan:  HOME/SELF CARE  In-house referral  Clinical Social Leisure centre managerWorker  Financial Counselor      DC Planning Services  CM consult  PCP issues      Choice offered to / List presented to:             Status of service:  Completed, signed off Medicare Important Message given?   (If response is "NO", the following Medicare IM given date fields will be blank) Date Medicare IM given:   Date Additional Medicare IM given:    Discharge Disposition:  HOME/SELF CARE  Per UR Regulation:    If discussed at Long Length of Stay Meetings, dates discussed:    Comments:  10/12/13 1115 Arlyss Queenammy Dakota Stangl, RN BSN CM

## 2013-10-12 NOTE — Clinical Social Work Note (Signed)
Wanted CSW to notify MD that he is experiencing back pain.  MD notified.  Santa GeneraAnne Cunningham, LCSW Clinical Social Worker [phone])

## 2013-10-12 NOTE — Clinical Social Work Psychosocial (Signed)
Clinical Social Work Department BRIEF PSYCHOSOCIAL ASSESSMENT 10/12/2013  Patient:  [name], [name]     Account Number:  [id number]     Admit date:  10/10/2013  Clinical Social Worker:  Edwyna Shell, [place]  Date/Time:  10/12/2013 10:00 AM  Referred by:  Physician  Date Referred:  10/12/2013 Referred for  Substance Abuse   Other Referral:   Interview type:  Patient Other interview type:    PSYCHOSOCIAL DATA Living Status:  FRIEND(S) Admitted from facility:   Level of care:   Primary support name:  Jeanell Sparrow (fiancee), Cherene Julian (mother) Primary support relationship to patient:  FRIEND Degree of support available:   [place] supportive and assists w transportation, lives w family and contributes to household via his intermittent work in [employer]    CURRENT CONCERNS Current Concerns  Substance Abuse   Other Concerns:    [place] / PLAN CSW met w patient at bedside, patient alert and oriented, anxious to discharge soon.  Admitted due to cocaine overdose secondary to injesting a packet of cocaine in order to conceal it from police.  Administered SBIRT, patient scored 26 (12 - consumption, 10 -dependence, problem - 4), indicating high risk drinking and almost certain depedence.  Discussed patient's use of alcohol and cocaine.  Patient says he began drinking liquor at age 43, by age 43 was drinking 2 pints of Hennessey, then added a case of Heineken beer to the liquor.  As he got older, he stated that "I laid off the liquor because my body couldnt handle it."  Currently admits to daily use of 8 25 oz cans of beer.  Says it helps to handle his "anxiety" and use "depends if I have something to occupy my time."  Patient works at [employer] "when there is work", seems determined to contribute to household where he stays so he prioritized work over attending scheduled appointments at [facility] for treatment of his bipolar disorder and possible  schizoaffective disorder.  Says alcohol use calms his anxiety.    While at [facility], provider had been treating him w Depakote, Neurontin, and Lexapro - "it didn't do much good."  Has also had treatment for alcohol abuse, including stays at [facility] and [facility].  Has participated in [place] meetings in past.  Voice positive sentiments towards resuming treatment at [facility] "if I can get transportation" and is considering attending AA again - "I think I have something to learn and something to give."    States that he is not ready to stop drinking, "I want to succeed at it - I dont want to say Ill do something and then fail."  However, is willing to consider alternatives to alcohol use to control his anxiety and may be willing to consider cutting down.  Says his cocaine use is "once in a blue moon, when Im around someone else who is doing it."    Patient referred to walk in clinic at [facility] as he can only attend in afternoon due to transportation difficulties.  Appointment made for 10/12/13 between 11 and 3.  Patient informed and says he will keep appointment, fiancee is off work today and can transport.    CSW signing off as no further SW needs identified.   Assessment/plan status:  Referral to [employer] Other assessment/ plan:   Information/referral to community resources:   AA meeting list  SBIRT  Cocaine handout    PATIENT'S/FAMILY'S RESPONSE TO PLAN OF CARE: Patient thanked CSW for  referral to clinic, says he may consider attending AA, may not be ready to stop drinking at this time but willing to consider making steps in direction of cessation.        Edwyna Shell, LCSW Clinical Social Worker [phone])

## 2013-10-12 NOTE — Discharge Summary (Signed)
Physician Discharge Summary  [name]:[id number] DOB: 14-Aug-1970 DOA: 10/10/2013  PCP: No PCP Per Patient  Admit date: 10/10/2013 Discharge date: 10/12/2013  Time spent: Greater than 30 minutes  Recommendations for Outpatient Follow-up:  1.   Discharge Diagnoses:  1. Cocaine overdose, secondary to the patient trying to hide cocaine from the police. 2. Accelerated hypertension, heart palpitations, and sinus tachycardia secondary to cocaine overdose. Resolved. 3. Lactic acidosis, secondary to cocaine overdose. Resolved. 4. History of hypertension. 5. History of diabetes, with no evidence during the hospitalization. Hemoglobin A1c was 5.4. 6. Chronic anxiety. Query history of bipolar disorder. 7. Polysubstance abuse including cocaine, alcohol, and tobacco. 8. Dizziness, likely secondary to medication side effect and wide swings in blood pressures.  Discharge Condition: Improved.  Diet recommendation: Heart healthy.  Filed Weights   10/10/13 1916 10/11/13 0500 10/12/13 0500  Weight: 76.6 kg (168 lb 14 oz) 76.6 kg (168 lb 14 oz) 76.5 kg (168 lb 10.4 oz)    History of present illness:   [name] is a 43 y.o. male with a history of hypertension, diet-controlled diabetes mellitus, bipolar disorder, and chronic anxiety, who presented from the local jail with a chief complaint of chest palpitations and transient chest pain. The patient was doing cocaine with friends. While driving, they were stopped by the police. Apparently, the patient had a warrant out for his arrest for " failure to comply". He apparently swallowed a small bag of cocaine he was trying to hide from the police. He denied suicidal intent. He denied trying to harm himself. He just wanted to hide the cocaine from the police. During his stay at the jail, he had been without any complaints. However, he developed a sudden onset of chest palpitations, transient chest pain, generalized weakness, sweatiness, and  lightheadedness. EMS was called. Reportedly, the patient had narrow complex supraventricular tachycardia and was given adenosine and diltiazem en route. When he arrived to the emergency department, his heart rate was in the 140s and his blood pressure was ranging from 195-220 systolically. He was given a 2 L bolus of normal saline, clonidine, and several milligrams of Ativan. His heart rate improved to the 100s to 120s. His palpitations resolved.  No history of abdominal pain, nausea, or vomiting. He was lost to followup for treatment of bipolar disorder and anxiety. He denies any recent manic episodes or severe depression. He does have occasional anxiety, but he drinks beer to help with this.   His EKG revealed sinus tachycardia with a heart rate of 130 beats per minute, and LVH. His chest x-ray revealed mild hyperinflation. His white blood cell count was elevated at 14.7, potassium was 3.5, C02  was 16, and glucose was 112. He was admitted for further evaluation and management    Hospital Course:   The patient was bolused IV fluids as stated above. He was then started on half-normal saline with an amp of bicarbonate to treat a lactic acidosis. There was no evidence of an acute infection or sepsis. The lactic acidosis was thought to be secondary to tachyarrhythmia and cocaine toxicity. He was supplemented with potassium chloride in the IV fluids and orally. He was given 1 g of magnesium sulfate empirically. Ativan was started 4 times daily to help with his history of anxiety and to prophylactically treat anxiety related cocaine toxicity and alcohol withdrawal syndrome. Clonidine was continued at 0.3 mg twice a day for treatment of malignant hypertension from the effects of cocaine toxicity. When necessary hydralazine  was also ordered. A nicotine patch was placed for nicotine replacement therapy. He was advised to stop smoking, but more importantly, he was strongly advised to stop using cocaine and to stop  drinking alcohol. Supportive treatment was given.  For evaluation, a number of studies were ordered. His troponin I was negative x3. His total CK was marginally elevated at 249. His lactic acid level normalized. His bicarbonate (CO2) normalized. His hemoglobin A1c was 5.4. His followup EKG revealed resolution of tachyarrhythmia, but with mildly prolonged QT interval. His followup magnesium level was 2.4. His TSH was within normal limits at 0.53. He complained of some dizziness which was thought to be secondary to lorazepam and the significant decrease in his blood pressure. For this reason, a CT scan of his head was ordered. It was negative for any acute changes. Clonidine and Ativan were tapered off and were given when necessary.  The patient's blood pressure improved and stabilized. His tachyarrhythmia completely normalize. He had no signs or symptoms consistent with acute anxiety, alcohol withdrawal syndrome, or manifestations of bipolar disorder. His electrolytes normalized. The clinical social worker was consulted to assist the patient with programs or services that would help with sobriety from alcohol and cocaine. He was referred to [facility] He was receptive. The case manager also made him appointment to followup with his new primary care provider at the [facility] clinic. He was discharged on hydrochlorothiazide for treatment of hypertension and vitamin therapy.  Procedures:  None  Consultations:  None  Discharge Exam: Filed Vitals:   10/12/13 0838  BP:   Pulse:   Temp: 97.6 F (36.4 C)  Resp:    pulse 56. Blood pressure 130/77. Oxygen saturation 100%. Respiratory rate 10-12.  General: Pleasant alert 43 year old African-American man sitting up in bed, in no acute distress. Cardiovascular: S1, S2, with a soft systolic murmur her and borderline bradycardia. Respiratory: Clear to auscultation bilaterally. Neuropsychiatric: He is alert and oriented x3. He is soft-spoken, but his speech  is clear. Cranial nerves II through XII are intact.  Discharge Instructions      Discharge Orders   Future Orders Complete By Expires   Diet - low sodium heart healthy  As directed    Discharge instructions  As directed    Comments:     Do not drink alcohol or use cocaine. Try to stop smoking. Seek assistance to stop  drug abuse/alcohol abuse.   Increase activity slowly  As directed        Medication List         hydrochlorothiazide 12.5 MG capsule  Commonly known as:  MICROZIDE  Take 1 capsule (12.5 mg total) by mouth daily. For treatment of your high blood pressure.     multivitamins with iron Tabs tablet  Take 1 tablet by mouth daily.     thiamine 100 MG tablet  Take 1 tablet (100 mg total) by mouth daily.       No Known Allergies Follow-up Information   Follow up with [facility] Today. (Walk in clinic appointment - today 3.9/14, between 11 AM and 3 PM - #[id number], [phone])       Follow up On 10/15/2013. (at 9:15)    Contact information:   [facility] [address] [place], [place] [id number]       The results of significant diagnostics from this hospitalization (including imaging, microbiology, ancillary and laboratory) are listed below for reference.    Significant Diagnostic Studies: Ct Head Wo Contrast  10/11/2013  CLINICAL DATA:  Dizziness, facial numbness.  EXAM: CT HEAD WITHOUT CONTRAST  TECHNIQUE: Contiguous axial images were obtained from the base of the skull through the vertex without intravenous contrast.  COMPARISON:  CT scan of May 27, 2011.  FINDINGS: Bony calvarium appears intact. No mass effect or midline shift is noted. Ventricular size is within normal limits. There is no evidence of mass lesion, hemorrhage or acute infarction.  IMPRESSION: No gross intracranial abnormality seen.   Electronically Signed   By: Roque Lias M.D.   On: 10/11/2013 17:08   Dg Chest Port 1 View  10/10/2013   CLINICAL DATA:  Vomiting  EXAM: PORTABLE  CHEST - 1 VIEW  COMPARISON:  None.  FINDINGS: The lungs are mildly hyperinflated but clear. The cardiopericardial silhouette is normal in size. External pacemaker pads are present. The pulmonary vascularity is not engorged. There is no pleural effusion or pneumothorax. The mediastinum is normal in width. The observed portions of the bony thorax exhibit no acute abnormalities.  IMPRESSION: There is mild hyperinflation which may be voluntary. There is no evidence of active cardiopulmonary disease.   Electronically Signed   By: David  [place]   On: 10/10/2013 15:29    Microbiology: Recent Results (from the past 240 hour(s))  MRSA PCR SCREENING     Status: None   Collection Time    10/10/13  9:30 PM      Result Value Ref Range Status   MRSA by PCR NEGATIVE  NEGATIVE Final   Comment:            The GeneXpert MRSA Assay (FDA     approved for NASAL specimens     only), is one component of a     comprehensive MRSA colonization     surveillance program. It is not     intended to diagnose MRSA     infection nor to guide or     monitor treatment for     MRSA infections.     Labs: Basic Metabolic Panel:  Recent Labs Lab 10/10/13 1510 10/11/13 0259 10/12/13 0426  NA 143 134* 137  K 3.5* 4.0 3.9  CL 104 99 101  CO2 16* 24 24  GLUCOSE 112* 103* 92  BUN 9 7 9   CREATININE 1.16 0.95 1.10  CALCIUM 8.9 8.9 9.1  MG  --  2.4  --    Liver Function Tests:  Recent Labs Lab 10/10/13 1510 10/11/13 0259 10/12/13 0426  AST 31 30 28   ALT 17 14 15   ALKPHOS 88 72 75  BILITOT 0.5 0.9 0.6  PROT 8.5* 7.6 7.6  ALBUMIN 4.0 3.6 3.5   No results found for this basename: LIPASE, AMYLASE,  in the last 168 hours No results found for this basename: AMMONIA,  in the last 168 hours CBC:  Recent Labs Lab 10/10/13 1510 10/11/13 0259 10/12/13 0426  WBC 14.7* 8.4 5.1  NEUTROABS 12.1*  --   --   HGB 13.7 12.7* 13.0  HCT 40.1 37.0* 38.4*  MCV 95.5 94.1 95.5  PLT 200 169 170   Cardiac  Enzymes:  Recent Labs Lab 10/10/13 1554 10/10/13 2118 10/11/13 0259 10/11/13 0851  CKTOTAL 249*  --   --   --   TROPONINI  --  <0.30 <0.30 <0.30   BNP: BNP (last 3 results) No results found for this basename: PROBNP,  in the last 8760 hours CBG: No results found for this basename: GLUCAP,  in the last 168 hours  Signed:  Jahzir Strohmeier  [facility] 10/12/2013, 12:17 PM

## 2013-10-12 NOTE — Progress Notes (Signed)
PT AMBULATED IN HALL AROUND NURSE'S STATION X2 W/O ANY SOB OR WEAKNESS. TOLERATED WELL.

## 2014-07-15 ENCOUNTER — Emergency Department (HOSPITAL_COMMUNITY): Payer: 59

## 2014-07-15 ENCOUNTER — Encounter (HOSPITAL_COMMUNITY): Payer: Self-pay | Admitting: Emergency Medicine

## 2014-07-15 ENCOUNTER — Emergency Department (HOSPITAL_COMMUNITY)
Admission: EM | Admit: 2014-07-15 | Discharge: 2014-07-16 | Disposition: A | Discharge status: Home / Self Care | Payer: 59 | Attending: Emergency Medicine | Admitting: Emergency Medicine

## 2014-07-15 DIAGNOSIS — Z720 Tobacco use: Secondary | ICD-10-CM | POA: Diagnosis not present

## 2014-07-15 DIAGNOSIS — E119 Type 2 diabetes mellitus without complications: Secondary | ICD-10-CM | POA: Insufficient documentation

## 2014-07-15 DIAGNOSIS — Z79899 Other long term (current) drug therapy: Secondary | ICD-10-CM | POA: Insufficient documentation

## 2014-07-15 DIAGNOSIS — I10 Essential (primary) hypertension: Secondary | ICD-10-CM | POA: Diagnosis not present

## 2014-07-15 DIAGNOSIS — Z8719 Personal history of other diseases of the digestive system: Secondary | ICD-10-CM | POA: Insufficient documentation

## 2014-07-15 DIAGNOSIS — Z8659 Personal history of other mental and behavioral disorders: Secondary | ICD-10-CM | POA: Insufficient documentation

## 2014-07-15 DIAGNOSIS — M25511 Pain in right shoulder: Secondary | ICD-10-CM

## 2014-07-15 NOTE — ED Notes (Signed)
Patient states he has been lifting furniture today and now c/o right shoulder pain.

## 2014-07-16 MED ORDER — NAPROXEN 500 MG PO TABS: 500.0000 mg | ORAL_TABLET | Freq: Two times a day (BID) | ORAL | Status: DC

## 2014-07-16 MED ORDER — HYDROMORPHONE HCL 1 MG/ML IJ SOLN
1.0000 mg | Freq: Once | INTRAMUSCULAR | Status: AC
Start: 2014-07-16 — End: 2014-07-16
  Administered 2014-07-16: 1 mg via INTRAMUSCULAR
  Filled 2014-07-16: qty 1

## 2014-07-16 MED ORDER — HYDROCODONE-ACETAMINOPHEN 5-325 MG PO TABS: 1.0000 | ORAL_TABLET | Freq: Four times a day (QID) | ORAL | Status: DC | PRN

## 2014-07-16 NOTE — Discharge Instructions (Signed)
Follow up with your family md next week if not improving.   Rest arm 2-3 days
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
## 2014-08-18 ENCOUNTER — Encounter: Payer: Self-pay | Admitting: Orthopedic Surgery

## 2015-04-01 IMAGING — CT CT HEAD W/O CM
1 series · 16 of 30 positions shown, 20 images · non-contrast
Comparison: CT scan of May 27, 2011.

CLINICAL DATA: Dizziness, facial numbness.

EXAM:
CT HEAD WITHOUT CONTRAST
TECHNIQUE: Contiguous axial images were obtained from the base of the skull
through the vertex without intravenous contrast.

[Series 2: headseq 4.8 h37s · axial · 0.43mm/px · z∈[+107,+260]mm · 16 of 36 slices shown, 20 images]
[im 2/36  brain]
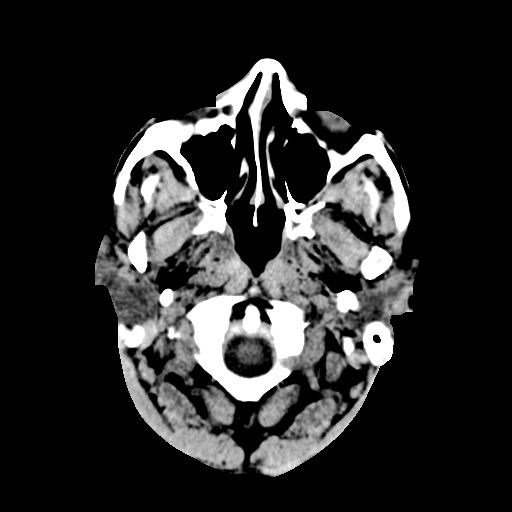
[im 2/36  bone]
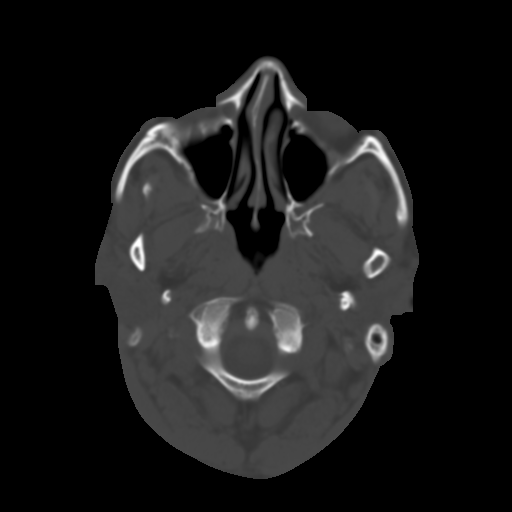
[im 4/36  brain]
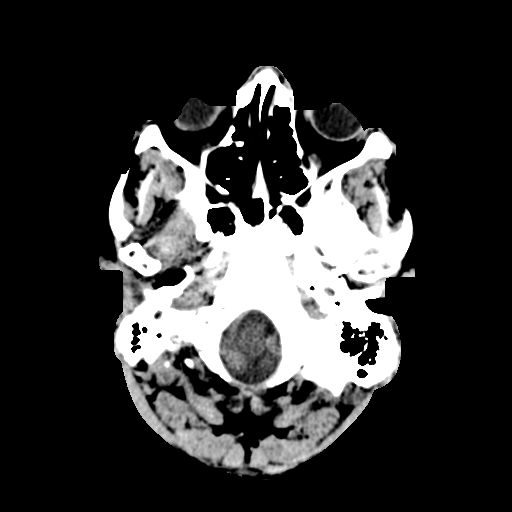
[im 7/36  brain]
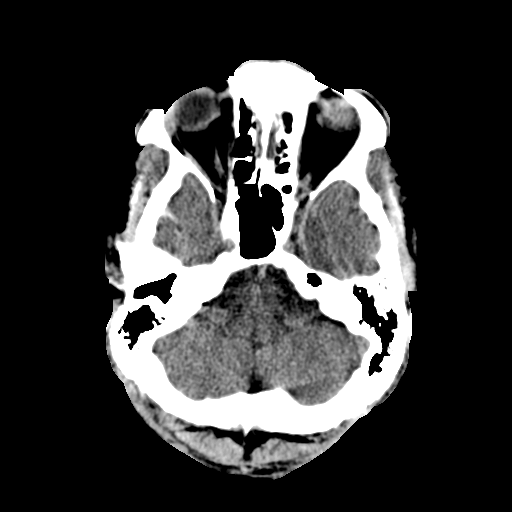
[im 9/36  brain]
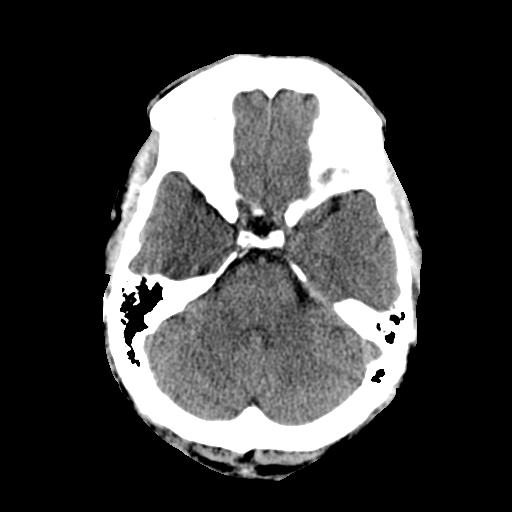
[im 10/36  brain]
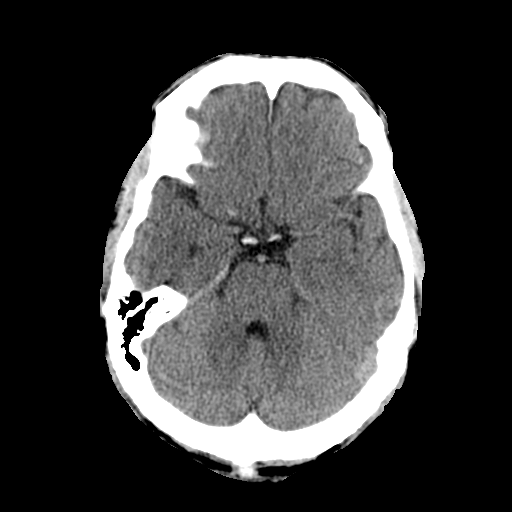
[im 10/36  bone]
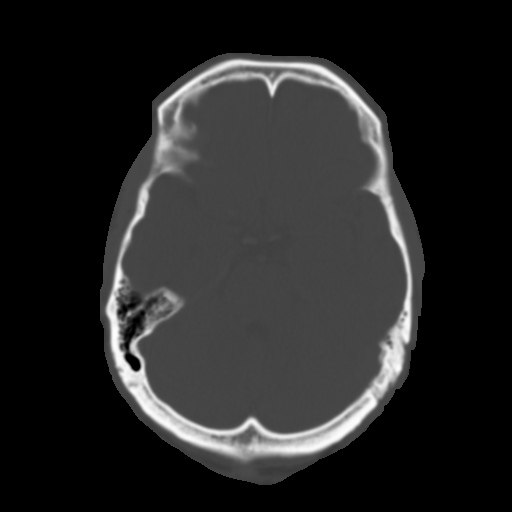
[im 13/36  brain]
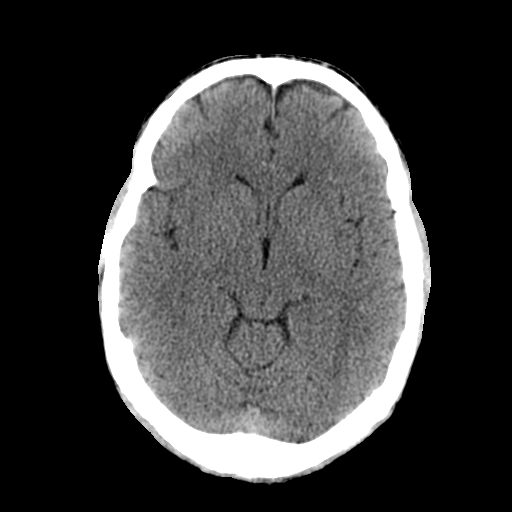
[im 15/36  brain]
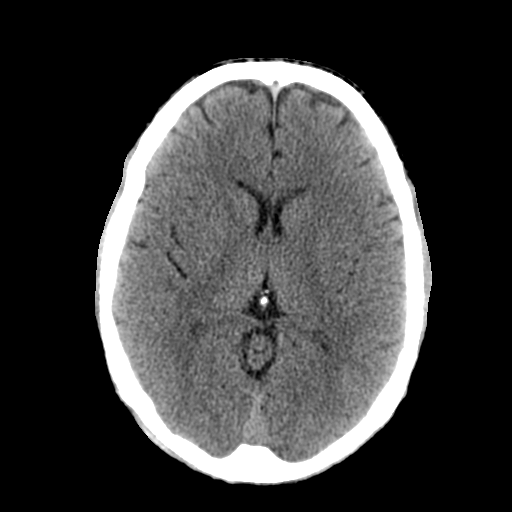
[im 17/36  brain]
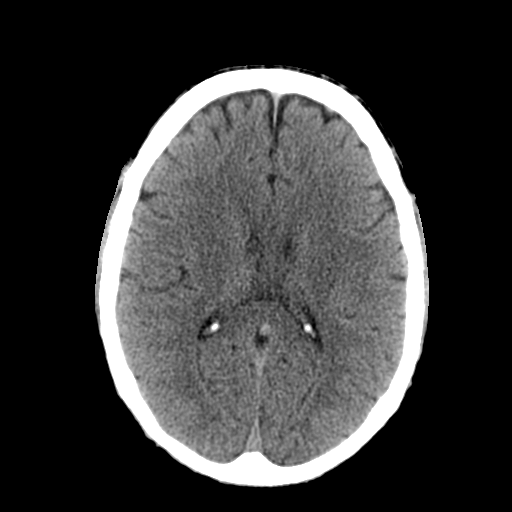
[im 19/36  brain]
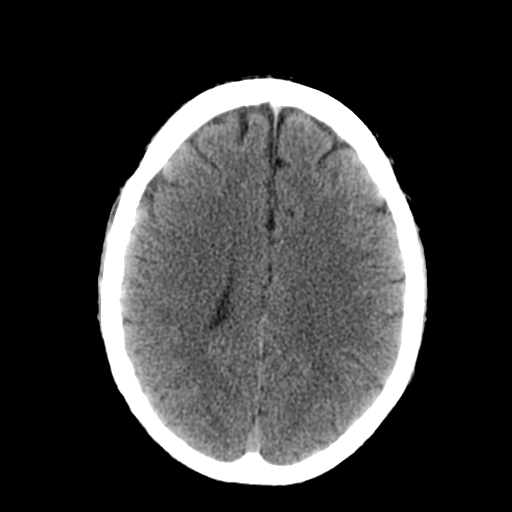
[im 19/36  bone]
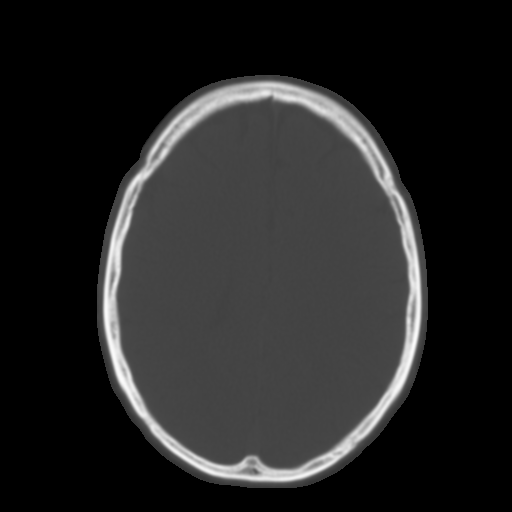
[im 21/36  brain]
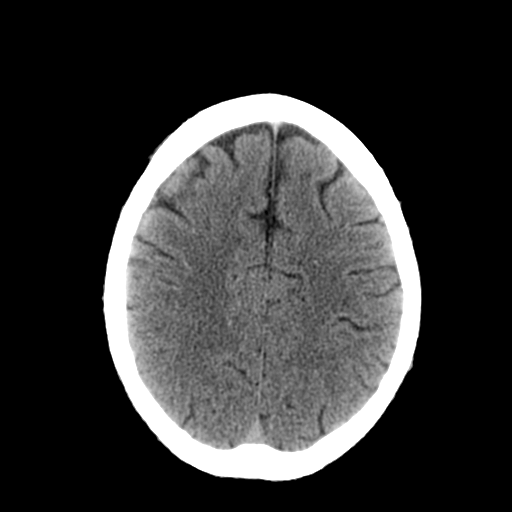
[im 23/36  brain]
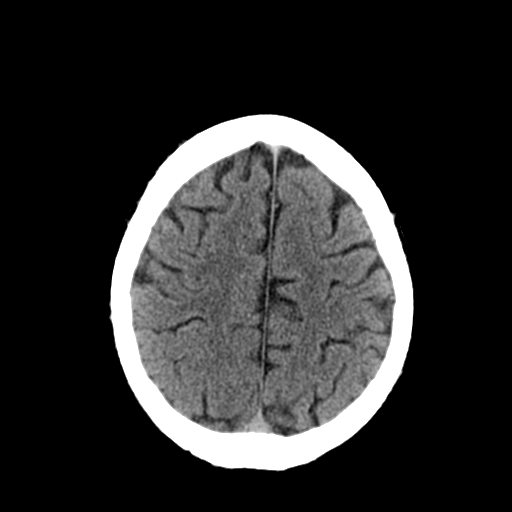
[im 26/36  brain]
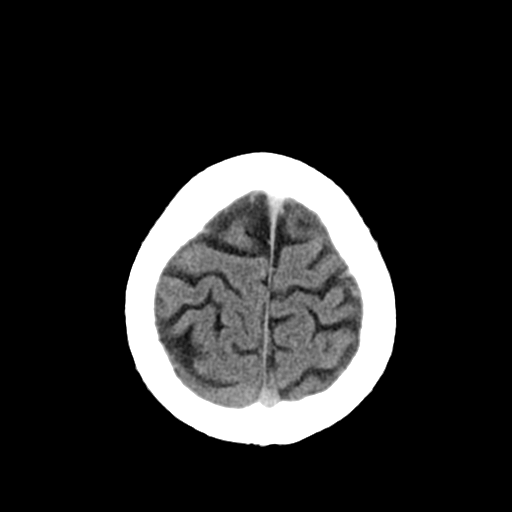
[im 27/36  brain]
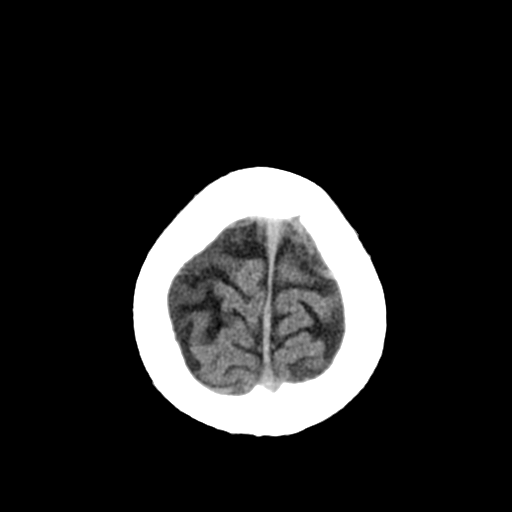
[im 27/36  bone]
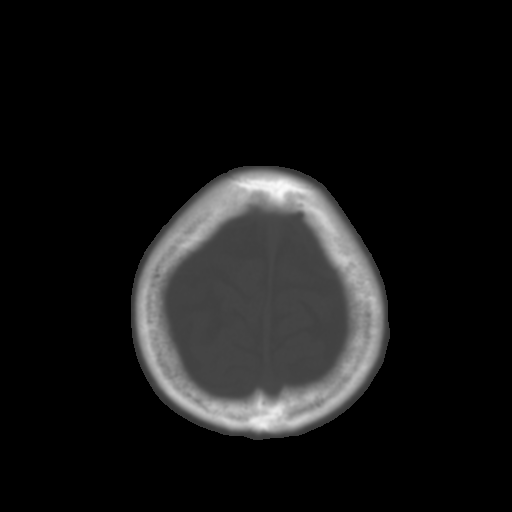
[im 29/36  brain]
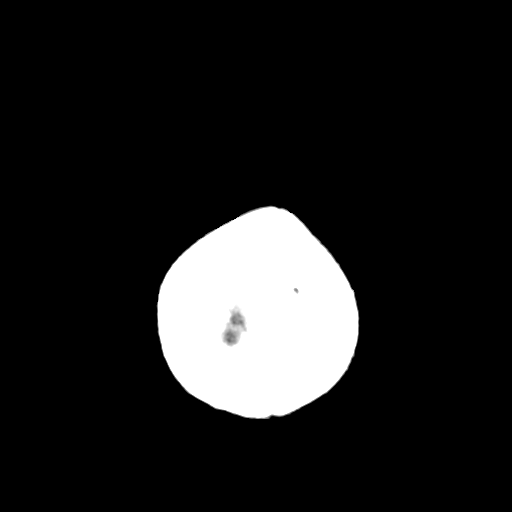
[im 32/36  brain]
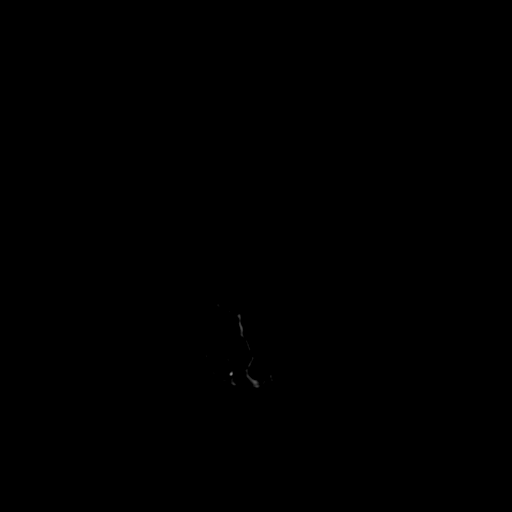
[im 34/36  brain]
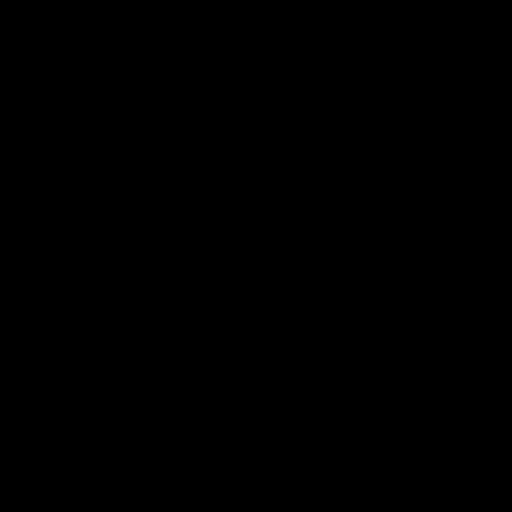

[16 of 30 positions shown; findings below may reference images not displayed]

FINDINGS: Bony calvarium appears intact. No mass effect or midline shift is
noted. Ventricular size is within normal limits. There is no
evidence of mass lesion, hemorrhage or acute infarction.
IMPRESSION: No gross intracranial abnormality seen.

## 2020-01-05 DIAGNOSIS — I639 Cerebral infarction, unspecified: Secondary | ICD-10-CM

## 2020-01-05 HISTORY — DX: Cerebral infarction, unspecified: I63.9

## 2020-09-14 DIAGNOSIS — R531 Weakness: Secondary | ICD-10-CM | POA: Diagnosis not present

## 2020-09-14 DIAGNOSIS — Z6825 Body mass index (BMI) 25.0-25.9, adult: Secondary | ICD-10-CM | POA: Diagnosis not present

## 2020-09-14 DIAGNOSIS — U071 COVID-19: Secondary | ICD-10-CM | POA: Diagnosis not present

## 2020-09-14 DIAGNOSIS — I10 Essential (primary) hypertension: Secondary | ICD-10-CM | POA: Diagnosis not present

## 2020-09-15 DIAGNOSIS — G459 Transient cerebral ischemic attack, unspecified: Secondary | ICD-10-CM | POA: Diagnosis not present

## 2021-08-09 DIAGNOSIS — I10 Essential (primary) hypertension: Secondary | ICD-10-CM | POA: Diagnosis not present

## 2021-08-09 DIAGNOSIS — Z6828 Body mass index (BMI) 28.0-28.9, adult: Secondary | ICD-10-CM | POA: Diagnosis not present

## 2021-08-09 DIAGNOSIS — L309 Dermatitis, unspecified: Secondary | ICD-10-CM | POA: Diagnosis not present

## 2021-08-09 DIAGNOSIS — Z7141 Alcohol abuse counseling and surveillance of alcoholic: Secondary | ICD-10-CM | POA: Diagnosis not present

## 2021-08-09 DIAGNOSIS — M25529 Pain in unspecified elbow: Secondary | ICD-10-CM | POA: Diagnosis not present

## 2021-08-14 ENCOUNTER — Encounter (INDEPENDENT_AMBULATORY_CARE_PROVIDER_SITE_OTHER): Payer: Self-pay | Admitting: *Deleted

## 2021-08-14 DIAGNOSIS — Z114 Encounter for screening for human immunodeficiency virus [HIV]: Secondary | ICD-10-CM | POA: Diagnosis not present

## 2021-09-07 ENCOUNTER — Other Ambulatory Visit: Payer: Self-pay

## 2021-09-07 ENCOUNTER — Ambulatory Visit (INDEPENDENT_AMBULATORY_CARE_PROVIDER_SITE_OTHER): Payer: Medicaid Other

## 2021-09-07 ENCOUNTER — Encounter: Payer: Self-pay | Admitting: Podiatry

## 2021-09-07 ENCOUNTER — Ambulatory Visit (INDEPENDENT_AMBULATORY_CARE_PROVIDER_SITE_OTHER): Payer: Medicaid Other | Admitting: Podiatry

## 2021-09-07 DIAGNOSIS — M79672 Pain in left foot: Secondary | ICD-10-CM

## 2021-09-07 DIAGNOSIS — M722 Plantar fascial fibromatosis: Secondary | ICD-10-CM

## 2021-09-07 DIAGNOSIS — M79671 Pain in right foot: Secondary | ICD-10-CM

## 2021-09-07 DIAGNOSIS — M779 Enthesopathy, unspecified: Secondary | ICD-10-CM

## 2021-09-07 DIAGNOSIS — M7752 Other enthesopathy of left foot: Secondary | ICD-10-CM

## 2021-09-07 DIAGNOSIS — M7751 Other enthesopathy of right foot: Secondary | ICD-10-CM

## 2021-09-07 MED ORDER — TRIAMCINOLONE ACETONIDE 10 MG/ML IJ SUSP
20.0000 mg | Freq: Once | INTRAMUSCULAR | Status: AC
  Administered 2021-09-07: 20 mg

## 2021-09-08 ENCOUNTER — Encounter (INDEPENDENT_AMBULATORY_CARE_PROVIDER_SITE_OTHER): Payer: Self-pay

## 2021-09-08 ENCOUNTER — Telehealth (INDEPENDENT_AMBULATORY_CARE_PROVIDER_SITE_OTHER): Payer: Self-pay

## 2021-09-08 ENCOUNTER — Encounter (INDEPENDENT_AMBULATORY_CARE_PROVIDER_SITE_OTHER): Payer: Self-pay | Admitting: *Deleted

## 2021-09-08 ENCOUNTER — Other Ambulatory Visit (INDEPENDENT_AMBULATORY_CARE_PROVIDER_SITE_OTHER): Payer: Self-pay

## 2021-09-08 DIAGNOSIS — Z1211 Encounter for screening for malignant neoplasm of colon: Secondary | ICD-10-CM

## 2021-09-08 DIAGNOSIS — Z800 Family history of malignant neoplasm of digestive organs: Secondary | ICD-10-CM

## 2021-09-08 MED ORDER — PEG 3350-KCL-NA BICARB-NACL 420 G PO SOLR: 4000.0000 mL | ORAL | 0 refills | Status: AC

## 2021-09-08 NOTE — Telephone Encounter (Signed)
Shacara Cozine Ann Briani Maul, CMA  ?

## 2021-09-08 NOTE — Telephone Encounter (Signed)
Referring MD/PCP: Hasanaj  Procedure: Tcs   Reason/Indication:  Screening, fam hx of colon ca  Has patient had this procedure before?  no  If so, when, by whom and where?    Is there a family history of colon cancer?  yes  Who?  What age when diagnosed? Grandfather  Is patient diabetic? If yes, Type 1 or Type 2   no      Does patient have prosthetic heart valve or mechanical valve?  no  Do you have a pacemaker/defibrillator?  no  Has patient ever had endocarditis/atrial fibrillation? no  Does patient use oxygen? no  Has patient had joint replacement within last 12 months?  no  Is patient constipated or do they take laxatives? no  Does patient have a history of alcohol/drug use?  no  Have you had a stroke/heart attack last 6 mths? no  Do you take medicine for weight loss?  no  For male patients,: have you had a hysterectomy N/A                      are you post menopausal N/A                      do you still have your menstrual cycle N/A  Is patient on blood thinner such as Coumadin, Plavix and/or Aspirin? no  Medications: Norvasc 100 mg daily, Amlodipine 5 mg daily, labetolol 300 mg daily, triamcinolone cream, Vit D3  Allergies: nkda  Medication Adjustment per  Dr Levon Hedger none  Procedure date & time: 09/26/21 at 1:15

## 2021-09-10 NOTE — Progress Notes (Signed)
Subjective:   Patient ID: [name], male   DOB: 51 y.o.   MRN: [id number]   HPI Patient presents with 2 separate problems with 1 being inflammation of the second and third joints of the right foot with fluid buildup and secondarily discomfort in the left plantar fascial at the insertional point of the tendon calcaneus.  Both of them are bothering him and make it hard to walk comfortably   ROS      Objective:  Physical Exam  Acute inflammation with fasciitis-like symptoms left and inflamed second and third MPJ right with patient found to have good digital perfusion well oriented x3     Assessment:  Acute inflammatory condition bilateral capsulitis right fasciitis left     Plan:  H&P conditions reviewed today I did periarticular injection around the second and third MPJ right 3 mg Dexasone Kenalog 5 mg Xylocaine I did sterile prep and injected the plantar fascial left 3 mg Kenalog 5 mg Xylocaine and advised on reduced activity and reappoint as symptoms indicate  X-rays were negative for signs of fracture or other bony issues with small spur formation plantar heel

## 2021-09-13 ENCOUNTER — Other Ambulatory Visit: Payer: Self-pay | Admitting: Podiatry

## 2021-09-13 DIAGNOSIS — M779 Enthesopathy, unspecified: Secondary | ICD-10-CM

## 2021-09-21 ENCOUNTER — Encounter (HOSPITAL_COMMUNITY): Payer: Self-pay

## 2021-09-21 ENCOUNTER — Encounter (HOSPITAL_COMMUNITY)
Admission: RE | Admit: 2021-09-21 | Discharge: 2021-09-21 | Disposition: A | Discharge status: Home / Self Care | Payer: Medicaid Other | Source: Ambulatory Visit | Attending: Gastroenterology | Admitting: Gastroenterology

## 2021-09-21 VITALS — HR 81 | Temp 98.0°F | Resp 18 | Ht 70.0 in | Wt 185.0 lb

## 2021-09-21 DIAGNOSIS — Z01812 Encounter for preprocedural laboratory examination: Secondary | ICD-10-CM | POA: Insufficient documentation

## 2021-09-21 DIAGNOSIS — Z01818 Encounter for other preprocedural examination: Secondary | ICD-10-CM

## 2021-09-21 LAB — RAPID URINE DRUG SCREEN, HOSP PERFORMED
Amphetamines: NOT DETECTED
Barbiturates: NOT DETECTED
Benzodiazepines: NOT DETECTED
Cocaine: POSITIVE — AB
Opiates: NOT DETECTED
Tetrahydrocannabinol: NOT DETECTED

## 2021-09-21 NOTE — Progress Notes (Signed)
Patient procedure cancelled after urine drug screen was positive for cocaine. Dr Wilburt Finlay office notified us and he was removed from the schedule. Left a message with the patient via VM.

## 2021-09-21 NOTE — Patient Instructions (Signed)
[name]  09/21/2021     @PREFPERIOPPHARMACY @   Your procedure is scheduled on 09/26/2021.  Report to [facility] at 11:45 A.M.  Call this number if you have problems the morning of surgery:  [phone]   Remember:   Please follow the diet and prep instructions given to you by the office.     Take these medicines the morning of surgery with A SIP OF WATER : Amlodipine and Labetolol    Do not wear jewelry, make-up or nail polish.  Do not wear lotions, powders, or perfumes, or deodorant.  Do not shave 48 hours prior to surgery.  Men may shave face and neck.  Do not bring valuables to the hospital.  [facility] is not responsible for any belongings or valuables.  Contacts, dentures or bridgework may not be worn into surgery.  Leave your suitcase in the car.  After surgery it may be brought to your room.  For patients admitted to the hospital, discharge time will be determined by your treatment team.  Patients discharged the day of surgery will not be allowed to drive home.   Name and phone number of your driver:   Family Special instructions:  N/A  Please read over the following fact sheets that you were given. Care and Recovery After Surgery  Colonoscopy, Adult A colonoscopy is a procedure to look at the entire large intestine. This procedure is done using a long, thin, flexible tube that has a camera on the end. You may have a colonoscopy: As a part of normal colorectal screening. If you have certain symptoms, such as: A low number of red blood cells in your blood (anemia). Diarrhea that does not go away. Pain in your abdomen. Blood in your stool. A colonoscopy can help screen for and diagnose medical problems, including: Tumors. Extra tissue that grows where mucus forms (polyps). Inflammation. Areas of bleeding. Tell your health care provider about: Any allergies you have. All medicines you are taking, including vitamins, herbs, eye drops, creams, and  over-the-counter medicines. Any problems you or family members have had with anesthetic medicines. Any blood disorders you have. Any surgeries you have had. Any medical conditions you have. Any problems you have had with having bowel movements. Whether you are pregnant or may be pregnant. What are the risks? Generally, this is a safe procedure. However, problems may occur, including: Bleeding. Damage to your intestine. Allergic reactions to medicines given during the procedure. Infection. This is rare. What happens before the procedure? Eating and drinking restrictions Follow instructions from your health care provider about eating or drinking restrictions, which may include: A few days before the procedure: Follow a low-fiber diet. Avoid nuts, seeds, dried fruit, raw fruits, and vegetables. 1-3 days before the procedure: Eat only gelatin dessert or ice pops. Drink only clear liquids, such as water, clear juice, clear broth or bouillon, black coffee or tea, or clear soft drinks or sports drinks. Avoid liquids that contain red or purple dye. The day of the procedure: Do not eat solid foods. You may continue to drink clear liquids until up to 2 hours before the procedure. Do not eat or drink anything starting 2 hours before the procedure, or within the time period that your health care provider recommends. Bowel prep If you were prescribed a bowel prep to take by mouth (orally) to clean out your colon: Take it as told by your health care provider. Starting the day before your procedure, you will need to  drink a large amount of liquid medicine. The liquid will cause you to have many bowel movements of loose stool until your stool becomes almost clear or light green. If your skin or the opening between the buttocks (anus) gets irritated from diarrhea, you may relieve the irritation using: Wipes with medicine in them, such as adult wet wipes with aloe and vitamin E. A product to soothe  skin, such as petroleum jelly. If you vomit while drinking the bowel prep: Take a break for up to 60 minutes. Begin the bowel prep again. Call your health care provider if you keep vomiting or you cannot take the bowel prep without vomiting. To clean out your colon, you may also be given: Laxative medicines. These help you have a bowel movement. Instructions for enema use. An enema is liquid medicine injected into your rectum. Medicines Ask your health care provider about: Changing or stopping your regular medicines or supplements. This is especially important if you are taking iron supplements, diabetes medicines, or blood thinners. Taking medicines such as aspirin and ibuprofen. These medicines can thin your blood. Do not take these medicines unless your health care provider tells you to take them. Taking over-the-counter medicines, vitamins, herbs, and supplements. General instructions Ask your health care provider what steps will be taken to help prevent infection. These may include washing skin with a germ-killing soap. Plan to have someone take you home from the hospital or clinic. What happens during the procedure?  An IV will be inserted into one of your veins. You may be given one or more of the following: A medicine to help you relax (sedative). A medicine to numb the area (local anesthetic). A medicine to make you fall asleep (general anesthetic). This is rarely needed. You will lie on your side with your knees bent. The tube will: Have oil or gel put on it (be lubricated). Be inserted into your anus. Be gently eased through all parts of your large intestine. Air will be sent into your colon to keep it open. This may cause some pressure or cramping. Images will be taken with the camera and will appear on a screen. A small tissue sample may be removed to be looked at under a microscope (biopsy). The tissue may be sent to a lab for testing if any signs of problems are  found. If small polyps are found, they may be removed and checked for cancer cells. When the procedure is finished, the tube will be removed. The procedure may vary among health care providers and hospitals. What happens after the procedure? Your blood pressure, heart rate, breathing rate, and blood oxygen level will be monitored until you leave the hospital or clinic. You may have a small amount of blood in your stool. You may pass gas and have mild cramping or bloating in your abdomen. This is caused by the air that was used to open your colon during the exam. Do not drive for 24 hours after the procedure. It is up to you to get the results of your procedure. Ask your health care provider, or the department that is doing the procedure, when your results will be ready. Summary A colonoscopy is a procedure to look at the entire large intestine. Follow instructions from your health care provider about eating and drinking before the procedure. If you were prescribed an oral bowel prep to clean out your colon, take it as told by your health care provider. During the colonoscopy, a flexible tube with a camera  on its end is inserted into the anus and then passed into the other parts of the large intestine. This information is not intended to replace advice given to you by your health care provider. Make sure you discuss any questions you have with your health care provider. Document Revised: 02/13/2019 Document Reviewed: 02/13/2019 Elsevier Patient Education  2022 [employer].

## 2021-09-25 ENCOUNTER — Ambulatory Visit (INDEPENDENT_AMBULATORY_CARE_PROVIDER_SITE_OTHER): Payer: Medicaid Other | Admitting: Gastroenterology

## 2021-09-26 ENCOUNTER — Encounter (HOSPITAL_COMMUNITY): Admission: RE | Payer: Self-pay | Source: Home / Self Care

## 2021-09-26 ENCOUNTER — Ambulatory Visit (HOSPITAL_COMMUNITY): Admission: RE | Admit: 2021-09-26 | Payer: Medicaid Other | Source: Home / Self Care | Admitting: Gastroenterology

## 2021-09-26 SURGERY — COLONOSCOPY WITH PROPOFOL
Anesthesia: Monitor Anesthesia Care

## 2022-01-04 ENCOUNTER — Ambulatory Visit: Payer: Medicaid Other | Admitting: Podiatry

## 2022-01-19 ENCOUNTER — Encounter: Payer: Self-pay | Admitting: Podiatry

## 2022-01-19 ENCOUNTER — Ambulatory Visit (INDEPENDENT_AMBULATORY_CARE_PROVIDER_SITE_OTHER): Payer: Medicaid Other | Admitting: Podiatry

## 2022-01-19 DIAGNOSIS — M2041 Other hammer toe(s) (acquired), right foot: Secondary | ICD-10-CM | POA: Diagnosis not present

## 2022-01-19 DIAGNOSIS — M7751 Other enthesopathy of right foot: Secondary | ICD-10-CM | POA: Diagnosis not present

## 2022-01-19 MED ORDER — TRIAMCINOLONE ACETONIDE 10 MG/ML IJ SUSP
10.0000 mg | Freq: Once | INTRAMUSCULAR | Status: AC
  Administered 2022-01-19: 10 mg

## 2022-01-19 NOTE — Progress Notes (Signed)
Subjective:   Patient ID: [name], male   DOB: 51 y.o.   MRN: [id number]   HPI Patient presents with inflammation pain of the fourth interspace right with keratotic lesion and also has digital deformities which could become painful   ROS      Objective:  Physical Exam  Patient presents with inflammatory fluid around the fifth MPJ right and is also noted to have keratotic lesion fifth digit right and left hallux     Assessment:  Inflammatory capsulitis fifth MPJ right foot along with hammertoe deformity that has keratotic lesion formation fifth right hallux left     Plan:  H&P discussed continued routine care and I did do a sterile prep and injected the fifth MPJ right 3 mg Dexasone Kenalog 5 mg Xylocaine I debrided lesions bilateral discussed hammertoe repair and explained to him what may be necessary in future.  Patient will be seen back as needed

## 2023-06-27 ENCOUNTER — Encounter (HOSPITAL_COMMUNITY): Payer: Self-pay

## 2023-10-28 ENCOUNTER — Encounter: Payer: Self-pay | Admitting: *Deleted

## 2023-11-04 ENCOUNTER — Encounter: Payer: Self-pay | Admitting: *Deleted

## 2023-11-25 ENCOUNTER — Emergency Department (HOSPITAL_COMMUNITY)
Admission: EM | Admit: 2023-11-25 | Discharge: 2023-11-25 | Discharge status: Against Medical Advice | Payer: MEDICAID | Attending: Emergency Medicine | Admitting: Emergency Medicine

## 2023-11-25 ENCOUNTER — Other Ambulatory Visit: Payer: Self-pay

## 2023-11-25 ENCOUNTER — Emergency Department (HOSPITAL_COMMUNITY): Payer: MEDICAID

## 2023-11-25 ENCOUNTER — Encounter (HOSPITAL_COMMUNITY): Payer: Self-pay

## 2023-11-25 DIAGNOSIS — R0602 Shortness of breath: Secondary | ICD-10-CM | POA: Insufficient documentation

## 2023-11-25 DIAGNOSIS — Z5321 Procedure and treatment not carried out due to patient leaving prior to being seen by health care provider: Secondary | ICD-10-CM | POA: Insufficient documentation

## 2023-11-25 DIAGNOSIS — R531 Weakness: Secondary | ICD-10-CM | POA: Insufficient documentation

## 2023-11-25 DIAGNOSIS — R079 Chest pain, unspecified: Secondary | ICD-10-CM | POA: Insufficient documentation

## 2023-11-25 DIAGNOSIS — R0981 Nasal congestion: Secondary | ICD-10-CM | POA: Diagnosis not present

## 2023-11-25 NOTE — ED Triage Notes (Signed)
 Pt to ED from home with c/o chest pain, congestion, and sob that started yesterday and has gotten worse today, pt also reports generalized weakness and no energy.

## 2023-11-25 NOTE — ED Notes (Signed)
 Pt told registration he was leaving b/c he didn't want to wait
